# Patient Record
Sex: Male | Born: 1960 | Race: Black or African American | Hispanic: No | Smoking: Current every day smoker
Health system: Southern US, Community
[De-identification: ages and names within clinical notes are randomized; demographics above are authoritative.]

## PROBLEM LIST (undated history)

## (undated) DIAGNOSIS — I10 Essential (primary) hypertension: Secondary | ICD-10-CM

## (undated) DIAGNOSIS — I739 Peripheral vascular disease, unspecified: Secondary | ICD-10-CM

## (undated) DIAGNOSIS — E119 Type 2 diabetes mellitus without complications: Secondary | ICD-10-CM

## (undated) DIAGNOSIS — E1169 Type 2 diabetes mellitus with other specified complication: Secondary | ICD-10-CM

## (undated) HISTORY — DX: Peripheral vascular disease, unspecified: I73.9

## (undated) HISTORY — DX: Type 2 diabetes mellitus without complications: E11.9

## (undated) HISTORY — DX: Essential (primary) hypertension: I10

## (undated) HISTORY — DX: Type 2 diabetes mellitus with other specified complication: E11.69

---

## 2005-04-26 ENCOUNTER — Ambulatory Visit: Payer: Self-pay | Admitting: Family Medicine

## 2005-04-27 ENCOUNTER — Ambulatory Visit (HOSPITAL_COMMUNITY): Admission: RE | Admit: 2005-04-27 | Discharge: 2005-04-27 | Payer: Self-pay | Admitting: Physician Assistant

## 2006-09-05 IMAGING — CR DG THORACIC SPINE 2V
3 series · 3 of 3 positions shown · non-contrast
Comparison: none

CLINICAL DATA: Back pain for 8 years, extending into both legs now, becoming worse over the past month. No known injury.
 THORACIC SPINE ? 3 VIEW:

[view not recorded (1 of 3)]
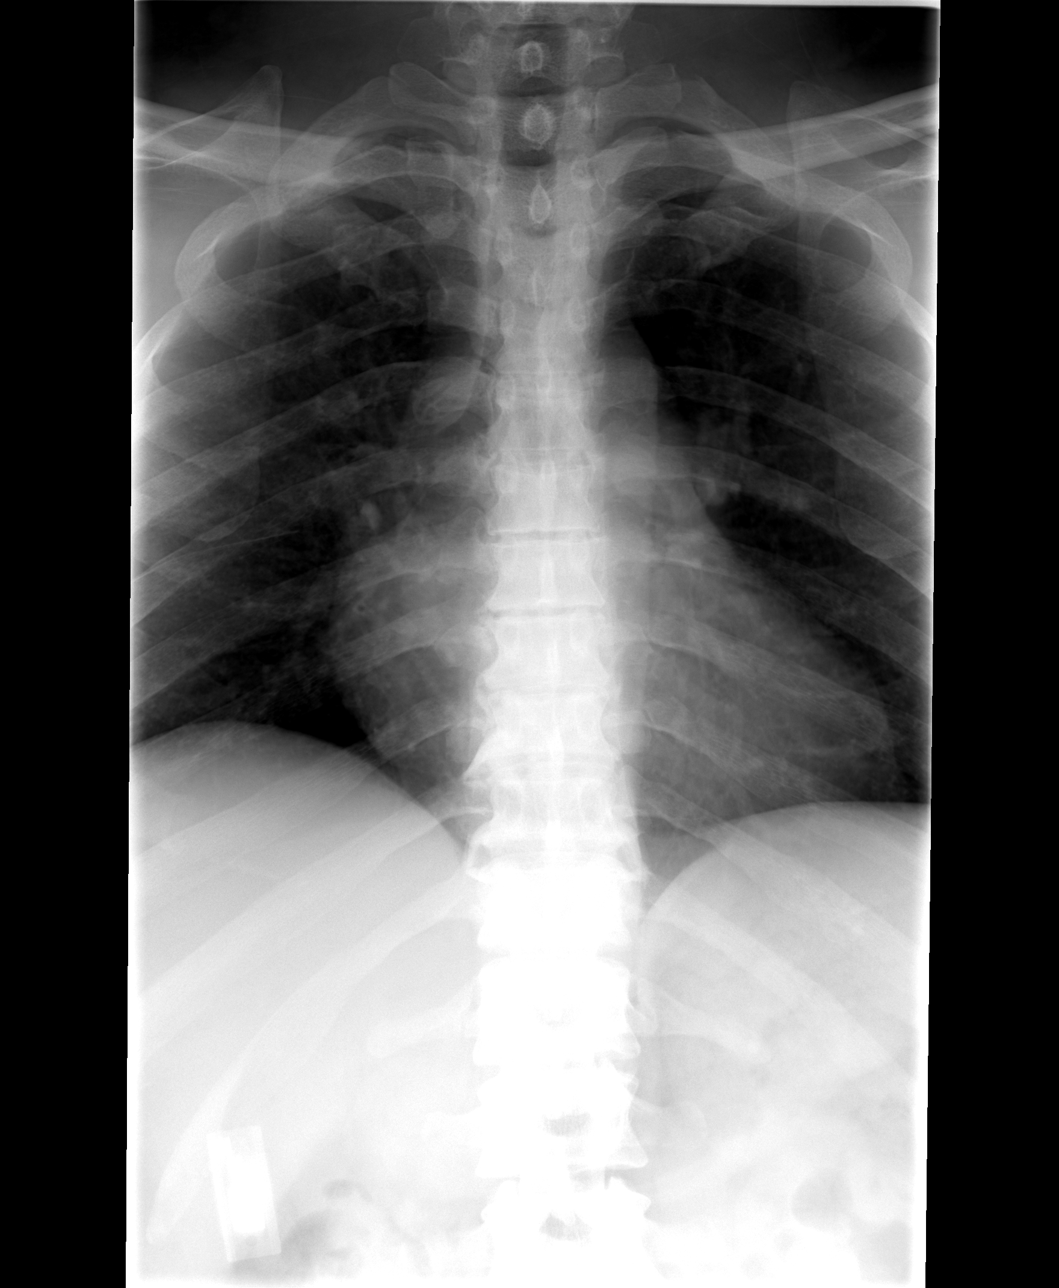

[view not recorded (2 of 3)]
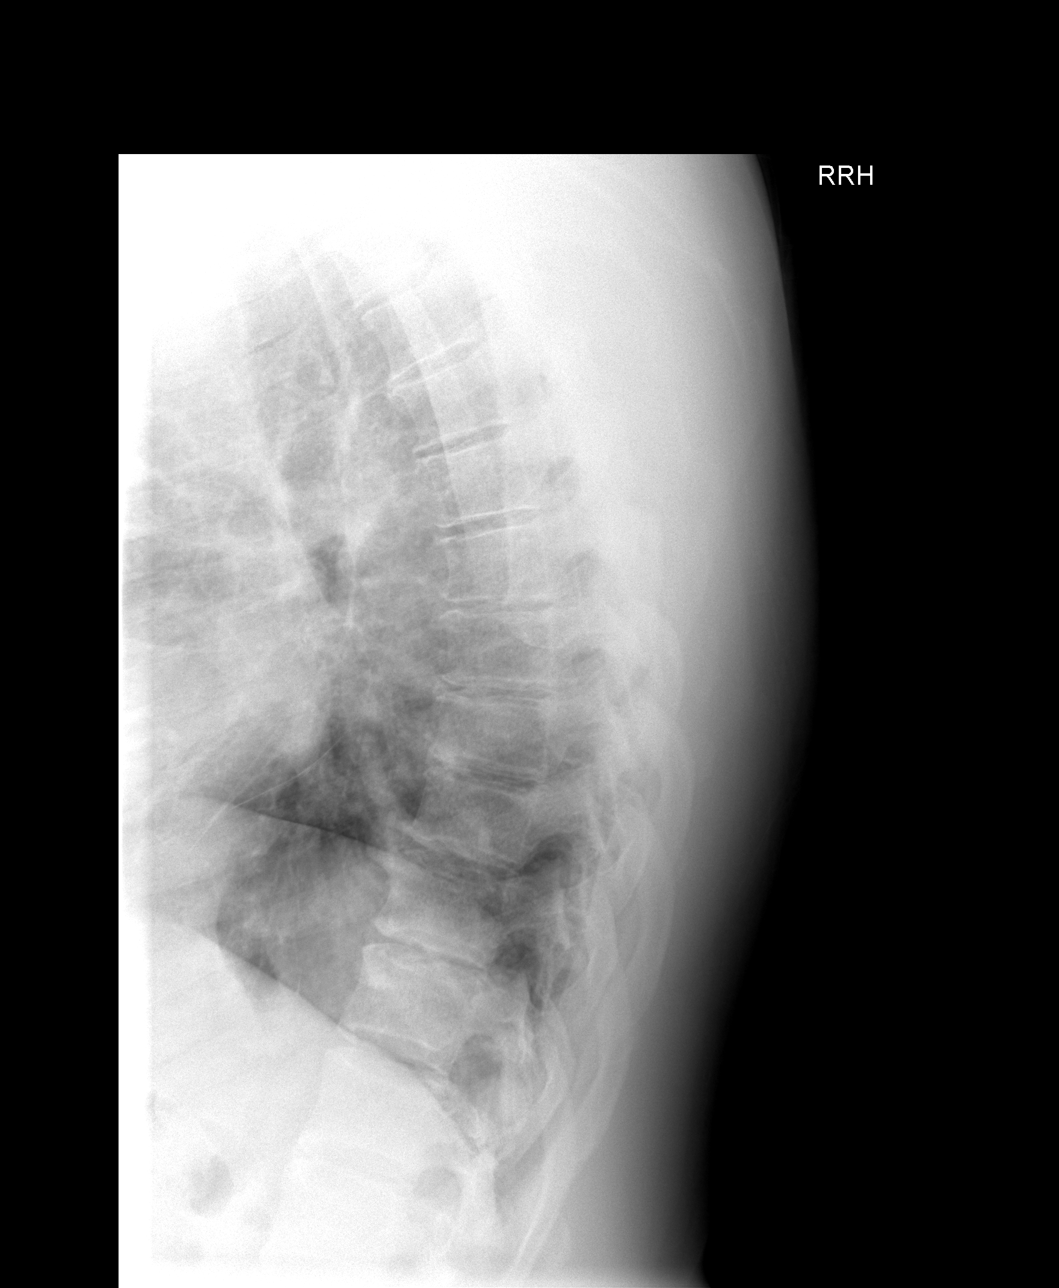

[view not recorded (3 of 3)]
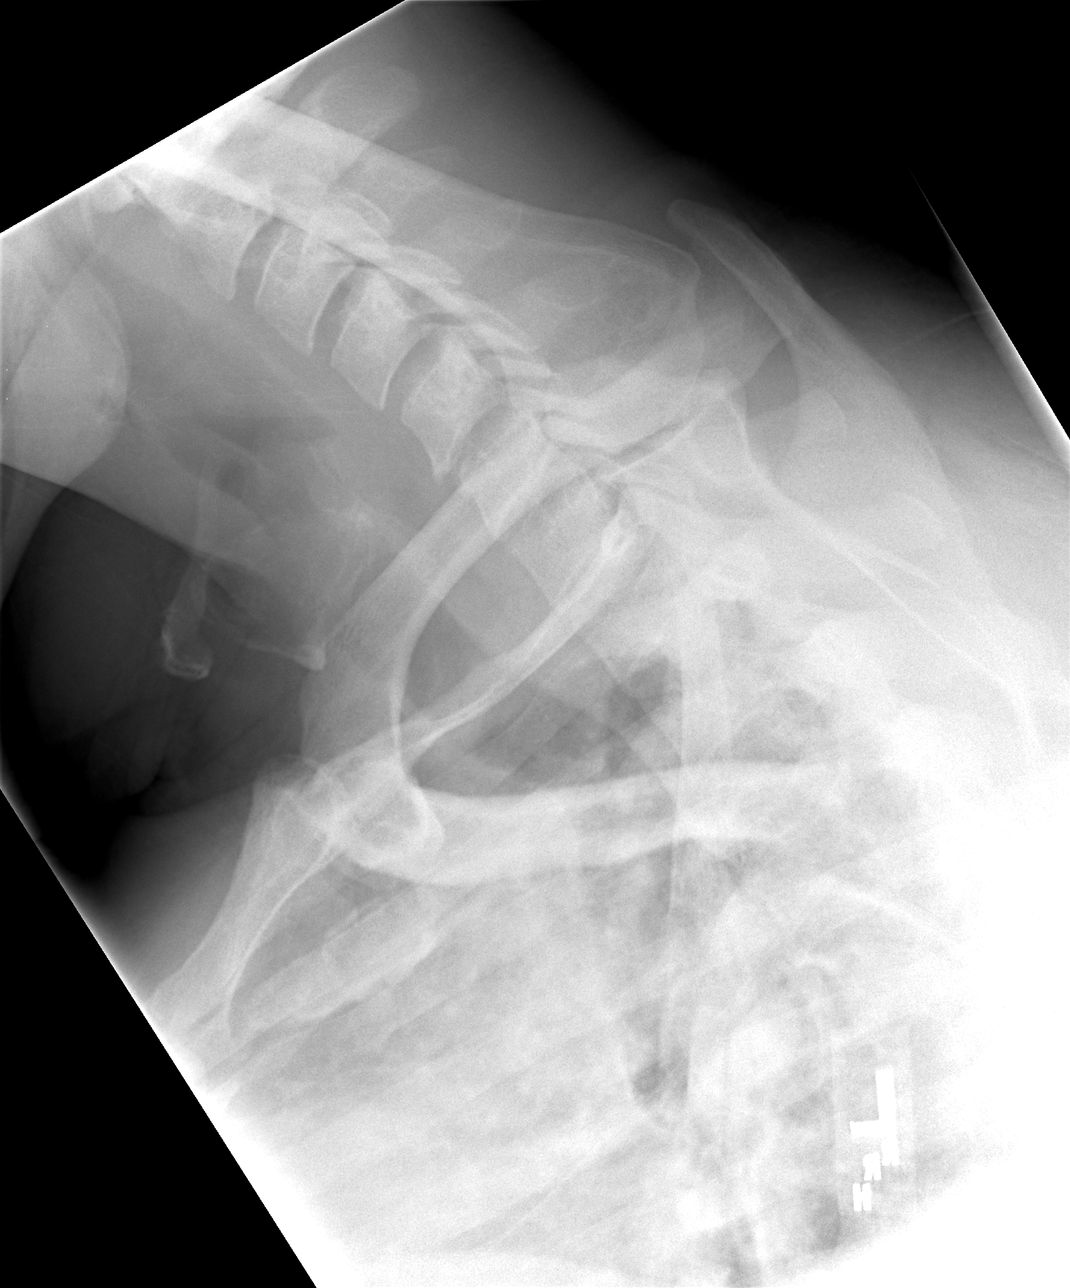

[3 of 3 positions shown; findings below may reference images not displayed]

FINDINGS: Three views of the thoracic spine are made without previous films for comparison and show anterior degenerative hypertrophic spurs associated with the lower thoracic spine, most prominent at the T9-10 and T10-11 levels.  There is no evidence of fracture or metastatic disease.  There is a mild dorsal kyphosis.  Posterior elements and soft tissues appear to be intact.
IMPRESSION: Degenerative hypertrophic spurs lower thoracic spine as described above.  No fracture or metastatic disease is seen.
 LUMBOSACRAL SPINE ? 5 VIEW:
FINDINGS: AP lateral and both oblique views of the lumbosacral spine show small anterior degenerative hypertrophic spurs at the L3-4, L4-5 and L5-S1 levels.  The intervertebral disk spaces and the posterior elements appear to be well maintained. There is no significant congenital abnormality.  The sacroiliac and hip joints appear normal as far as can be seen.
IMPRESSION: Small anterior degenerative hypertrophic spurs mid and lower lumbar spine.  No acute changes are seen.

## 2013-06-18 ENCOUNTER — Ambulatory Visit: Payer: Self-pay | Admitting: Family Medicine

## 2022-08-23 ENCOUNTER — Ambulatory Visit: Payer: Self-pay

## 2022-08-23 ENCOUNTER — Encounter: Payer: Self-pay | Admitting: Podiatry

## 2022-08-23 DIAGNOSIS — M778 Other enthesopathies, not elsewhere classified: Secondary | ICD-10-CM

## 2022-08-28 NOTE — Progress Notes (Signed)
This encounter was created in error - please disregard.

## 2022-08-30 ENCOUNTER — Ambulatory Visit (INDEPENDENT_AMBULATORY_CARE_PROVIDER_SITE_OTHER): Payer: Medicare PPO | Admitting: Podiatry

## 2022-08-30 ENCOUNTER — Encounter: Payer: Self-pay | Admitting: Podiatry

## 2022-08-30 VITALS — BP 178/87 | HR 115 | Temp 99.7°F

## 2022-08-30 DIAGNOSIS — I96 Gangrene, not elsewhere classified: Secondary | ICD-10-CM | POA: Diagnosis not present

## 2022-08-30 DIAGNOSIS — M2041 Other hammer toe(s) (acquired), right foot: Secondary | ICD-10-CM

## 2022-08-30 DIAGNOSIS — I739 Peripheral vascular disease, unspecified: Secondary | ICD-10-CM

## 2022-08-30 MED ORDER — OXYCODONE-ACETAMINOPHEN 5-325 MG PO TABS: 1.0000 | ORAL_TABLET | ORAL | 0 refills | Status: DC | PRN

## 2022-08-30 MED ORDER — DOXYCYCLINE HYCLATE 100 MG PO TABS: 100.0000 mg | ORAL_TABLET | Freq: Two times a day (BID) | ORAL | 0 refills | Status: DC

## 2022-08-30 NOTE — Progress Notes (Signed)
Subjective:  Patient ID: Evan Nelson, male    DOB: 1961-07-25,  MRN: [id number]  Chief Complaint  Patient presents with   Toe Pain    5th toe left - bumped toe 3 months ago, had a small abrasion and them it turned dark, started to swell, worsened and darkened over the last 3 weeks, very sore, throbbing   New Patient (Initial Visit)    62 y.o. male presents with the above complaint.  Patient presents with left fifth digit gangrene.  Patient states that it has progressive gotten worse after he stubbed his toe.  He has not seen anyone as prior to seeing me.  It is dry and stable.  He has not been taking any antibiotics.  He went to get it evaluated.  He does not have any vascular history.   Review of Systems: Negative except as noted in the HPI. Denies N/V/F/Ch.  No past medical history on file. No current facility-administered medications for this visit. No current outpatient medications on file.  Facility-Administered Medications Ordered in Other Visits:    [id number] sodium chloride 0.9 % bolus 1,000 mL, 1,000 mL, Intravenous, Once, Stopped at 09/08/22 1439 **AND** 0.9 %  sodium chloride infusion, , Intravenous, Continuous, Donne Hazel, MD, Last Rate: 75 mL/hr at 09/11/22 0435, New Bag at 09/11/22 0435   0.9 %  sodium chloride infusion, 250 mL, Intravenous, PRN, Broadus John, MD   acetaminophen (TYLENOL) tablet 650 mg, 650 mg, Oral, Q4H PRN, Broadus John, MD   aspirin EC tablet 81 mg, 81 mg, Oral, Daily, Broadus John, MD, 81 mg at 09/11/22 0818   atorvastatin (LIPITOR) tablet 40 mg, 40 mg, Oral, QHS, Broadus John, MD, 40 mg at 09/10/22 1733   cefTRIAXone (ROCEPHIN) 2 g in sodium chloride 0.9 % 100 mL IVPB, 2 g, Intravenous, Q24H, Tat, David, MD, Last Rate: 200 mL/hr at 09/11/22 0826, 2 g at 09/11/22 0826   [START ON 09/12/2022] Chlorhexidine Gluconate Cloth 2 % PADS 6 each, 6 each, Topical, Q0600, Donne Hazel, MD   clopidogrel (PLAVIX) tablet 75 mg, 75 mg,  Oral, Q breakfast, Broadus John, MD, 75 mg at 09/11/22 0818   enoxaparin (LOVENOX) injection 40 mg, 40 mg, Subcutaneous, Q24H, Tat, David, MD, 40 mg at 09/10/22 2138   hydrALAZINE (APRESOLINE) injection 5 mg, 5 mg, Intravenous, Q20 Min PRN, Broadus John, MD   HYDROcodone-acetaminophen (NORCO/VICODIN) 5-325 MG per tablet 1 tablet, 1 tablet, Oral, Q4H PRN, Donne Hazel, MD, 1 tablet at 09/11/22 0134   HYDROmorphone (DILAUDID) injection 1 mg, 1 mg, Intravenous, Q4H PRN, Donne Hazel, MD, 1 mg at 09/11/22 0818   insulin aspart (novoLOG) injection 0-15 Units, 0-15 Units, Subcutaneous, TID WC, Donne Hazel, MD, 2 Units at 09/10/22 1734   insulin aspart (novoLOG) injection 0-5 Units, 0-5 Units, Subcutaneous, QHS, Donne Hazel, MD   labetalol (NORMODYNE) injection 10 mg, 10 mg, Intravenous, Q10 min PRN, Broadus John, MD   mupirocin ointment (BACTROBAN) 2 % 1 Application, 1 Application, Nasal, BID, Donne Hazel, MD   ondansetron [facility]) injection 4 mg, 4 mg, Intravenous, Q6H PRN, Broadus John, MD   sodium chloride flush (NS) 0.9 % injection 3 mL, 3 mL, Intravenous, Q12H, Broadus John, MD, 3 mL at 09/11/22 0252   sodium chloride flush (NS) 0.9 % injection 3 mL, 3 mL, Intravenous, PRN, Broadus John, MD   vancomycin (VANCOREADY) IVPB 1500 mg/300 mL, 1,500 mg, Intravenous, Q24H, Chiu,  Orpah Melter, MD, Last Rate: 150 mL/hr at 09/10/22 1840, 1,500 mg at 09/10/22 1840  Social History   Tobacco Use  Smoking Status Every Day   Types: Cigarettes  Smokeless Tobacco Never    Allergies  Allergen Reactions   Amoxicillin    Penicillins    Objective:   Vitals:   08/30/22 1511  BP: (!) 178/87  Pulse: (!) 115  Temp: 99.7 F (37.6 C)   There is no height or weight on file to calculate BMI. Constitutional Well developed. Well nourished.  Vascular Dorsalis pedis pulses nonpalpable bilaterally. Posterior tibial pulses nonpalpable bilaterally. Capillary refill normal to  all digits.  No cyanosis or clubbing noted. Pedal hair growth normal.  Neurologic Normal speech. Oriented to person, place, and time. Epicritic sensation to light touch grossly present bilaterally.  Dermatologic Left fifth digit gangrene without any signs of infection.  No purulent drainage noted no erythema noted.  No malodor present.  Appears to be dry and stable fully demarcated  Orthopedic: Normal joint ROM without pain or crepitus bilaterally. No visible deformities. No bony tenderness.   Radiographs: None Assessment:   1. Gangrene of toe ([place])   2. Peripheral vascular disease ([place])    Plan:  Patient was evaluated and treated and all questions answered.  Left fifth digit gangrene fully demarcated -All questions and concerns were discussed with the patient in extensive detail -I discussed with the patient that he is a high risk of losing that digit versus part of the foot.  I discussed with the patient in extensive detail he states understanding.  Clinically at this time there is no signs of infection I believe patient will benefit from ABIs PVRs and vascular consult. -If the digit itself regresses advised to go to the emergency room right away he states understanding. -Stat ABIs PVRs were ordered and vascular workup in place.  No follow-ups on file.

## 2022-08-31 ENCOUNTER — Telehealth (HOSPITAL_COMMUNITY): Payer: Self-pay

## 2022-08-31 NOTE — Telephone Encounter (Signed)
Number on file: The number you have dialed is no longer in service Work number on file: [employer], unable to confirm via information on file that the patient works here Emergency contact: Same as number on file

## 2022-09-03 ENCOUNTER — Telehealth: Payer: Self-pay | Admitting: Podiatry

## 2022-09-03 NOTE — Telephone Encounter (Signed)
Pt calling back to check status of medication request. He states his pharmacy closes early and wants to be sure he would be able to pick up medication today if possible.  Please advise

## 2022-09-03 NOTE — Telephone Encounter (Signed)
Pt is requesting a stronger pain medication. He states the oxycodone is not working.  [facility] - [place], [place]   Please advise

## 2022-09-04 MED ORDER — HYDROCODONE-ACETAMINOPHEN 5-325 MG PO TABS: 1.0000 | ORAL_TABLET | Freq: Four times a day (QID) | ORAL | 0 refills | Status: DC | PRN

## 2022-09-04 NOTE — Addendum Note (Signed)
Addended by: Boneta Lucks on: 09/04/2022 08:21 AM   Modules accepted: Orders

## 2022-09-08 ENCOUNTER — Encounter (HOSPITAL_COMMUNITY): Payer: Self-pay | Admitting: Internal Medicine

## 2022-09-08 ENCOUNTER — Inpatient Hospital Stay (HOSPITAL_COMMUNITY)
Admission: EM | Admit: 2022-09-08 | Discharge: 2022-09-14 | DRG: 240 | Disposition: A | Discharge status: Home Health | Payer: Medicare PPO | Attending: Internal Medicine | Admitting: Internal Medicine

## 2022-09-08 ENCOUNTER — Emergency Department (HOSPITAL_COMMUNITY): Payer: Medicare PPO

## 2022-09-08 ENCOUNTER — Other Ambulatory Visit: Payer: Self-pay

## 2022-09-08 DIAGNOSIS — E1152 Type 2 diabetes mellitus with diabetic peripheral angiopathy with gangrene: Principal | ICD-10-CM | POA: Diagnosis present

## 2022-09-08 DIAGNOSIS — L97526 Non-pressure chronic ulcer of other part of left foot with bone involvement without evidence of necrosis: Secondary | ICD-10-CM | POA: Diagnosis present

## 2022-09-08 DIAGNOSIS — I70262 Atherosclerosis of native arteries of extremities with gangrene, left leg: Secondary | ICD-10-CM | POA: Diagnosis present

## 2022-09-08 DIAGNOSIS — M869 Osteomyelitis, unspecified: Secondary | ICD-10-CM

## 2022-09-08 DIAGNOSIS — Z880 Allergy status to penicillin: Secondary | ICD-10-CM | POA: Diagnosis not present

## 2022-09-08 DIAGNOSIS — M86172 Other acute osteomyelitis, left ankle and foot: Secondary | ICD-10-CM | POA: Diagnosis not present

## 2022-09-08 DIAGNOSIS — F17210 Nicotine dependence, cigarettes, uncomplicated: Secondary | ICD-10-CM | POA: Diagnosis present

## 2022-09-08 DIAGNOSIS — I70245 Atherosclerosis of native arteries of left leg with ulceration of other part of foot: Secondary | ICD-10-CM | POA: Diagnosis not present

## 2022-09-08 DIAGNOSIS — R000 Tachycardia, unspecified: Secondary | ICD-10-CM | POA: Diagnosis not present

## 2022-09-08 DIAGNOSIS — I708 Atherosclerosis of other arteries: Secondary | ICD-10-CM | POA: Diagnosis present

## 2022-09-08 DIAGNOSIS — Z7984 Long term (current) use of oral hypoglycemic drugs: Secondary | ICD-10-CM

## 2022-09-08 DIAGNOSIS — Z7982 Long term (current) use of aspirin: Secondary | ICD-10-CM | POA: Diagnosis not present

## 2022-09-08 DIAGNOSIS — I96 Gangrene, not elsewhere classified: Secondary | ICD-10-CM

## 2022-09-08 DIAGNOSIS — E1165 Type 2 diabetes mellitus with hyperglycemia: Secondary | ICD-10-CM | POA: Diagnosis present

## 2022-09-08 DIAGNOSIS — M86672 Other chronic osteomyelitis, left ankle and foot: Secondary | ICD-10-CM | POA: Diagnosis not present

## 2022-09-08 DIAGNOSIS — M7989 Other specified soft tissue disorders: Secondary | ICD-10-CM | POA: Diagnosis present

## 2022-09-08 DIAGNOSIS — E1169 Type 2 diabetes mellitus with other specified complication: Secondary | ICD-10-CM | POA: Diagnosis present

## 2022-09-08 DIAGNOSIS — Z79899 Other long term (current) drug therapy: Secondary | ICD-10-CM | POA: Diagnosis not present

## 2022-09-08 DIAGNOSIS — E11621 Type 2 diabetes mellitus with foot ulcer: Secondary | ICD-10-CM | POA: Diagnosis present

## 2022-09-08 DIAGNOSIS — I70229 Atherosclerosis of native arteries of extremities with rest pain, unspecified extremity: Secondary | ICD-10-CM | POA: Diagnosis not present

## 2022-09-08 HISTORY — DX: Gangrene, not elsewhere classified: I96

## 2022-09-08 HISTORY — DX: Osteomyelitis, unspecified: M86.9

## 2022-09-08 HISTORY — DX: Other acute osteomyelitis, left ankle and foot: M86.172

## 2022-09-08 LAB — CULTURE, BLOOD (ROUTINE X 2)

## 2022-09-08 LAB — CBC WITH DIFFERENTIAL/PLATELET
Abs Immature Granulocytes: 0.1 10*3/uL — ABNORMAL HIGH (ref 0.00–0.07)
Basophils Absolute: 0.1 10*3/uL (ref 0.0–0.1)
Basophils Relative: 1 %
Eosinophils Absolute: 0.2 10*3/uL (ref 0.0–0.5)
Eosinophils Relative: 2 %
HCT: 47 % (ref 39.0–52.0)
Hemoglobin: 15.3 g/dL (ref 13.0–17.0)
Immature Granulocytes: 1 %
Lymphocytes Relative: 29 %
Lymphs Abs: 3 10*3/uL (ref 0.7–4.0)
MCH: 29.3 pg (ref 26.0–34.0)
MCHC: 32.6 g/dL (ref 30.0–36.0)
MCV: 89.9 fL (ref 80.0–100.0)
Monocytes Absolute: 0.6 10*3/uL (ref 0.1–1.0)
Monocytes Relative: 5 %
Neutro Abs: 6.5 10*3/uL (ref 1.7–7.7)
Neutrophils Relative %: 62 %
Platelets: 566 10*3/uL — ABNORMAL HIGH (ref 150–400)
RBC: 5.23 MIL/uL (ref 4.22–5.81)
RDW: 13.6 % (ref 11.5–15.5)
WBC: 10.4 10*3/uL (ref 4.0–10.5)
nRBC: 0 % (ref 0.0–0.2)

## 2022-09-08 LAB — COMPREHENSIVE METABOLIC PANEL
ALT: 30 U/L (ref 0–44)
AST: 22 U/L (ref 15–41)
Albumin: 3.7 g/dL (ref 3.5–5.0)
Alkaline Phosphatase: 87 U/L (ref 38–126)
Anion gap: 13 (ref 5–15)
BUN: 16 mg/dL (ref 8–23)
CO2: 26 mmol/L (ref 22–32)
Calcium: 9.8 mg/dL (ref 8.9–10.3)
Chloride: 96 mmol/L — ABNORMAL LOW (ref 98–111)
Creatinine, Ser: 1.22 mg/dL (ref 0.61–1.24)
GFR, Estimated: >60 mL/min (ref >60)
Glucose, Bld: 184 mg/dL — ABNORMAL HIGH (ref 70–99)
Potassium: 3.9 mmol/L (ref 3.5–5.1)
Sodium: 135 mmol/L (ref 135–145)
Total Bilirubin: 0.5 mg/dL (ref 0.3–1.2)
Total Protein: 8.5 g/dL — ABNORMAL HIGH (ref 6.5–8.1)

## 2022-09-08 LAB — LACTIC ACID, PLASMA: Lactic Acid, Venous: 1.6 mmol/L (ref 0.5–1.9)

## 2022-09-08 MED ORDER — VANCOMYCIN HCL 1250 MG/250ML IV SOLN
1250.0000 mg | INTRAVENOUS | Status: DC
  Filled 2022-09-08: qty 250

## 2022-09-08 MED ORDER — HYDROCODONE-ACETAMINOPHEN 5-325 MG PO TABS
1.0000 | ORAL_TABLET | Freq: Four times a day (QID) | ORAL | Status: DC | PRN
  Administered 2022-09-08 – 2022-09-10 (×5): 1 via ORAL
  Filled 2022-09-08 (×6): qty 1

## 2022-09-08 MED ORDER — ENOXAPARIN SODIUM 40 MG/0.4ML IJ SOSY
40.0000 mg | PREFILLED_SYRINGE | INTRAMUSCULAR | Status: DC
  Administered 2022-09-10 – 2022-09-13 (×4): 40 mg via SUBCUTANEOUS
  Filled 2022-09-08 (×5): qty 0.4

## 2022-09-08 MED ORDER — VANCOMYCIN HCL 2000 MG/400ML IV SOLN
2000.0000 mg | Freq: Once | INTRAVENOUS | Status: AC
  Administered 2022-09-08: 2000 mg via INTRAVENOUS
  Filled 2022-09-08: qty 400

## 2022-09-08 MED ORDER — SODIUM CHLORIDE 0.9 % IV SOLN
2.0000 g | INTRAVENOUS | Status: DC
  Administered 2022-09-08: 2 g via INTRAVENOUS
  Filled 2022-09-08: qty 20

## 2022-09-08 MED ORDER — SODIUM CHLORIDE 0.9 % IV SOLN: INTRAVENOUS | Status: DC

## 2022-09-08 MED ORDER — SODIUM CHLORIDE 0.9 % IV SOLN
2.0000 g | INTRAVENOUS | Status: DC
  Administered 2022-09-09 – 2022-09-13 (×5): 2 g via INTRAVENOUS
  Filled 2022-09-08 (×5): qty 20

## 2022-09-08 MED ORDER — METRONIDAZOLE 500 MG/100ML IV SOLN
500.0000 mg | Freq: Two times a day (BID) | INTRAVENOUS | Status: DC
  Administered 2022-09-08: 500 mg via INTRAVENOUS
  Filled 2022-09-08: qty 100

## 2022-09-08 MED ORDER — SODIUM CHLORIDE 0.9 % IV BOLUS
1000.0000 mL | Freq: Once | INTRAVENOUS | Status: AC
  Administered 2022-09-08: 1000 mL via INTRAVENOUS

## 2022-09-08 NOTE — ED Triage Notes (Signed)
Pt to ED c/o left small toe pain/drainage x 3 months, reports progressively getting worse, reports pending apt for possible amputation.

## 2022-09-08 NOTE — Consult Note (Signed)
   Received a call from Forestine Na, ED.  Worsening infection with osteomyelitis of the foot.  Plans for transfer to [facility] for full workup and likely amputation.  Podiatry will consult on patient once admitted to [facility].   Edrick Kins, DPM [facility]  Dr. Edrick Kins, DPM    [address], [place] [address]                Office [phone]  Fax [phone]

## 2022-09-08 NOTE — ED Provider Notes (Signed)
[facility] Provider Note   CSN: [id number] Arrival date & time: 09/08/22  1202     History  Chief Complaint  Patient presents with   Toe Pain    Left little    Evan Nelson is a 62 y.o. male.   Toe Pain  Patient presents for left foot infection.  Has had chronic issues with his left fifth toe.  Had recent seen podiatry with plan of vascular workup.  Started on antibiotics and pain medicine.  However now more pain and more swelling.  No fevers but increasing redness on the foot.  States has been using peroxide on the toe and now the toe is turned black.  Had seen Dr. Posey Pronto from [facility].    No past medical history on file.  Home Medications Prior to Admission medications   Medication Sig Start Date End Date Taking? Authorizing Provider  doxycycline (VIBRA-TABS) 100 MG tablet Take 1 tablet (100 mg total) by mouth 2 (two) times daily. 08/30/22   Felipa Furnace, DPM  HYDROcodone-acetaminophen (NORCO) 5-325 MG tablet Take 1 tablet by mouth every 6 (six) hours as needed for moderate pain. 09/04/22   Felipa Furnace, DPM  oxyCODONE-acetaminophen (PERCOCET) 5-325 MG tablet Take 1 tablet by mouth every 4 (four) hours as needed for severe pain. 08/30/22   Felipa Furnace, DPM      Allergies    Amoxicillin and Penicillins    Review of Systems   Review of Systems  Physical Exam Updated Vital Signs BP 138/77   Pulse (!) 128   Temp 98.4 F (36.9 C) (Oral)   Resp 20   Ht 5' 7"$  (1.702 m)   Wt 89.4 kg   SpO2 96%   BMI 30.85 kg/m  Physical Exam Vitals and nursing note reviewed.  Cardiovascular:     Rate and Rhythm: Tachycardia present.  Pulmonary:     Breath sounds: No wheezing.  Abdominal:     Tenderness: There is no abdominal tenderness.  Musculoskeletal:     Comments: Erythema with some swelling along dorsum of left foot.  Swelling compared to contralateral side.  Does have dopplerable dorsalis pedis pulse.  Does have  blackening of the fifth toe but is some liquid air proximal to this.  Neurological:     Mental Status: He is alert.     ED Results / Procedures / Treatments   Labs (all labs ordered are listed, but only abnormal results are displayed) Labs Reviewed  COMPREHENSIVE METABOLIC PANEL - Abnormal; Notable for the following components:      Result Value   Chloride 96 (*)    Glucose, Bld 184 (*)    Total Protein 8.5 (*)    All other components within normal limits  CBC WITH DIFFERENTIAL/PLATELET - Abnormal; Notable for the following components:   Platelets 566 (*)    Abs Immature Granulocytes 0.10 (*)    All other components within normal limits  CULTURE, BLOOD (ROUTINE X 2)  CULTURE, BLOOD (ROUTINE X 2)  LACTIC ACID, PLASMA    EKG None  Radiology DG Foot Complete Left  Result Date: 09/08/2022 CLINICAL DATA:  Left small toe infection EXAM: LEFT FOOT - COMPLETE 3+ VIEW COMPARISON:  None Available. FINDINGS: Erosive changes of the distal phalanx of the left fifth toe compatible with osteomyelitis. Soft tissue irregularity with soft tissue air at the fifth toe distally. No additional sites of bone erosion or periosteal elevation. No fracture or  dislocation IMPRESSION: Erosive changes of the distal phalanx of the left fifth toe compatible with osteomyelitis. Soft tissue irregularity with soft tissue air at the fifth toe distally. Electronically Signed   By: Davina Poke D.O.   On: 09/08/2022 14:00    Procedures Procedures    Medications Ordered in ED Medications  sodium chloride 0.9 % bolus 1,000 mL (1,000 mLs Intravenous New Bag/Given 09/08/22 1312)    And  0.9 %  sodium chloride infusion (has no administration in time range)  cefTRIAXone (ROCEPHIN) 2 g in sodium chloride 0.9 % 100 mL IVPB (has no administration in time range)    And  metroNIDAZOLE (FLAGYL) IVPB 500 mg (has no administration in time range)    ED Course/ Medical Decision Making/ A&P                              Medical Decision Making Amount and/or Complexity of Data Reviewed Labs: ordered. Radiology: ordered.  Risk Prescription drug management.   Patient with foot infection.  Tachycardia.  Has been on doxycycline without relief.  Appears chronically necrotic toe but appears to be acute on chronic infection.  Differential diagnoses really determines the amount of infection and the extent of it.  X-ray done and independently interpreted showed likely osteomyelitis.  Is some air proximally.  White count reassuring.  Lactic acid reassuring.  Antibiotics given.  Has penicillin allergy but appears candidate for cephalosporin. Discussed with Dr. Amalia Hailey from podiatry.  Will see patient in consult since he is one of their patients but will need transfer to [facility].  Likely require surgery.  Will discuss with hospitalist for admission.        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

## 2022-09-08 NOTE — Hospital Course (Addendum)
62 year old male with no documented chronic medical history presenting with pain and edema of his left foot.  The patient states that he bumped his little toe about 6 months ago.  He subsequently dropped an object on it a few months later.  He noticed a sore that was not healing.  He subsequently went to see podiatry, Dr. Boneta Lucks, on 08/30/2022.  The patient was given a prescription for norco and doxycycline.  The plan was for the patient to have outpatient vascular studies to be performed.  The patient was also instructed to paint his foot with Betadine.  Unfortunately, the patient pain with his foot particularly with weightbearing.  He stated that it is not particularly any worse, but was concerned that his fifth toe was turning more black.  He continues to complain of swelling and erythema.  He states that they were not any worse than usual.  He denies any fevers, chills, headache, chest pain, shortness breath, cough, hemoptysis, nausea, vomiting, direct abdominal pain.  There is no dysuria, hematuria. In the ED, the patient was afebrile hemodynamically stable with oxygen saturation 96% room air.  WBC 10.4, hemoglobin 15.3, platelets 566,000.  Sodium 135, potassium 3.9, bicarbonate 26, BUN 16, creatinine 1.22.  LFTs were unremarkable.  X-ray of the left foot shows erosive changes of the distal phalanx of the left fifth toe.  There was a small amount of soft tissue air in the left fifth toe distally.  Podiatry, Dr. Daylene Katayama was consulted and recommend transfer to [facility] for evaluation and possible operative intervention.  I also contacted [facility], Dr. Trula Slade who is agreeable for consult.

## 2022-09-08 NOTE — Plan of Care (Signed)

## 2022-09-08 NOTE — H&P (Signed)
History and Physical    Patient: Evan Nelson [id number] DOB: 02/09/1961 DOA: 09/08/2022 DOS: the patient was seen and examined on 09/08/2022 PCP: Pcp, No  Patient coming from: Home  Chief Complaint:  Chief Complaint  Patient presents with   Toe Pain    Left little   HPI: Evan Nelson is a 62 year old male with no documented chronic medical history presenting with pain and edema of his left foot.  The patient states that he bumped his little toe about 6 months ago.  He subsequently dropped an object on it a few months later.  He noticed a sore that was not healing.  He subsequently went to see podiatry, Dr. Boneta Lucks, on 08/30/2022.  The patient was given a prescription for norco and doxycycline.  The plan was for the patient to have outpatient vascular studies to be performed.  The patient was also instructed to paint his foot with Betadine.  Unfortunately, the patient pain with his foot particularly with weightbearing.  He stated that it is not particularly any worse, but was concerned that his fifth toe was turning more black.  He continues to complain of swelling and erythema.  He states that they were not any worse than usual.  He denies any fevers, chills, headache, chest pain, shortness breath, cough, hemoptysis, nausea, vomiting, direct abdominal pain.  There is no dysuria, hematuria. In the ED, the patient was afebrile hemodynamically stable with oxygen saturation 96% room air.  WBC 10.4, hemoglobin 15.3, platelets 566,000.  Sodium 135, potassium 3.9, bicarbonate 26, BUN 16, creatinine 1.22.  LFTs were unremarkable.  X-ray of the left foot shows erosive changes of the distal phalanx of the left fifth toe.  There was a small amount of soft tissue air in the left fifth toe distally.  Podiatry, Dr. Daylene Katayama was consulted and recommend transfer to [facility] for evaluation and possible operative intervention.  I also contacted [facility], Dr. Trula Slade who is agreeable for consult.  Review of  Systems: As mentioned in the history of present illness. All other systems reviewed and are negative. No past medical history on file. Surg Hx:  denies prior surgery Social History:  reports that he has been smoking cigarettes. He has never used smokeless tobacco. He reports that he does not currently use alcohol. No history on file for drug use.  25 pack years  Allergies  Allergen Reactions   Amoxicillin    Penicillins     Family hx--no pertinent family hx  Prior to Admission medications   Medication Sig Start Date End Date Taking? Authorizing Provider  acetaminophen (TYLENOL) 500 MG tablet Take 1,000 mg by mouth every 6 (six) hours as needed for moderate pain.   Yes [provider]  doxycycline (VIBRA-TABS) 100 MG tablet Take 1 tablet (100 mg total) by mouth 2 (two) times daily. 08/30/22  Yes Felipa Furnace, DPM  HYDROcodone-acetaminophen (NORCO) 5-325 MG tablet Take 1 tablet by mouth every 6 (six) hours as needed for moderate pain. 09/04/22  Yes Felipa Furnace, DPM  oxyCODONE-acetaminophen (PERCOCET) 5-325 MG tablet Take 1 tablet by mouth every 4 (four) hours as needed for severe pain. Patient not taking: Reported on 09/08/2022 08/30/22   Felipa Furnace, DPM    Physical Exam: Vitals:   09/08/22 1231 09/08/22 1233 09/08/22 1439  BP: 138/77  (!) 159/99  Pulse: (!) 128  96  Resp: 20  (!) 22  Temp: 98.4 F (36.9 C)    TempSrc: Oral  SpO2: 96%  100%  Weight:  89.4 kg   Height:  5' 7"$  (1.702 m)    GENERAL:  A&O x 3, NAD, well developed, cooperative, follows commands HEENT: [place]/AT, No thrush, No icterus, No oral ulcers Neck:  No neck mass, No meningismus, soft, supple CV: RRR, no S3, no S4, no rub, no JVD Lungs:  CTA, no wheeze, no rhonchi, good air movement Abd: soft/NT +BS, nondistended Ext: see pics of left foot below Neuro:  CN II-XII intact, strength 4/5 in RUE, RLE, strength 4/5 LUE, LLE; sensation intact bilateral; no dysmetria; babinski equivocal        Data  Reviewed: {Data reviewed above in history  Assessment and Plan: Acute osteomyelitis left fifth toe/dry gangrene left foot -The patient likely has underlying peripheral vascular disease -His foot is cool to the touch without palpable pulse, but he has sensory and motor function -Start vancomycin and ceftriaxone -ESR -CRP -ABIs -Discussed with Dr. Crisoforo Oxford to consult>>please contact [facility] upon arrival -Podiatry, Dr. Daylene Katayama also aware of consult  Hyperglycemia -Nonfasting serum glucose 184 upon arrival -Check hemoglobin A1c  Personal History of PCN allergy -occurred 30 yr ago>>hives   Advance Care Planning:FULL  Consults: [facility], podiatry  Family Communication: none  Severity of Illness: The appropriate patient status for this patient is INPATIENT. Inpatient status is judged to be reasonable and necessary in order to provide the required intensity of service to ensure the patient's safety. The patient's presenting symptoms, physical exam findings, and initial radiographic and laboratory data in the context of their chronic comorbidities is felt to place them at high risk for further clinical deterioration. Furthermore, it is not anticipated that the patient will be medically stable for discharge from the hospital within 2 midnights of admission.   * I certify that at the point of admission it is my clinical judgment that the patient will require inpatient hospital care spanning beyond 2 midnights from the point of admission due to high intensity of service, high risk for further deterioration and high frequency of surveillance required.*  Author: Orson Eva, MD 09/08/2022 3:45 PM  For on call review [email or web address].

## 2022-09-08 NOTE — Progress Notes (Signed)
Pharmacy Antibiotic Note  Evan Nelson is a 62 y.o. male admitted on 09/08/2022 with  osteomyelitis .  Pharmacy has been consulted for vancomycin dosing.  Plan: Vancomycin 2000 mg IV x 1 dose. Vancomycin 1250 mg IV every 24 hours. Ceftriaxone 2000 mg IV every 24 hours. Monitor labs, c/s, and vanco level as indicated.  Height: 5' 7"$  (170.2 cm) Weight: 89.4 kg (197 lb) IBW/kg (Calculated) : 66.1  Temp (24hrs), Avg:98.4 F (36.9 C), Min:98.4 F (36.9 C), Max:98.4 F (36.9 C)  Recent Labs  Lab 09/08/22 1245  WBC 10.4  CREATININE 1.22  LATICACIDVEN 1.6    Estimated Creatinine Clearance: 66.1 mL/min (by C-G formula based on SCr of 1.22 mg/dL).    Allergies  Allergen Reactions   Amoxicillin    Penicillins     Antimicrobials this admission: Vanco 2/10 >>  CTX 2/10>>  Microbiology results: 2/10 BCx: pending   Thank you for allowing pharmacy to be a part of this patient's care.  Margot Ables, PharmD Clinical Pharmacist 09/08/2022 3:57 PM

## 2022-09-09 ENCOUNTER — Encounter (HOSPITAL_COMMUNITY): Payer: Medicare PPO

## 2022-09-09 DIAGNOSIS — I70262 Atherosclerosis of native arteries of extremities with gangrene, left leg: Secondary | ICD-10-CM

## 2022-09-09 DIAGNOSIS — M86172 Other acute osteomyelitis, left ankle and foot: Secondary | ICD-10-CM | POA: Diagnosis not present

## 2022-09-09 DIAGNOSIS — M869 Osteomyelitis, unspecified: Secondary | ICD-10-CM | POA: Diagnosis not present

## 2022-09-09 DIAGNOSIS — M86672 Other chronic osteomyelitis, left ankle and foot: Secondary | ICD-10-CM

## 2022-09-09 LAB — CBC
HCT: 41.2 % (ref 39.0–52.0)
Hemoglobin: 13.8 g/dL (ref 13.0–17.0)
MCH: 29.8 pg (ref 26.0–34.0)
MCHC: 33.5 g/dL (ref 30.0–36.0)
MCV: 89 fL (ref 80.0–100.0)
Platelets: 488 10*3/uL — ABNORMAL HIGH (ref 150–400)
RBC: 4.63 MIL/uL (ref 4.22–5.81)
RDW: 13.6 % (ref 11.5–15.5)
WBC: 9.9 10*3/uL (ref 4.0–10.5)
nRBC: 0 % (ref 0.0–0.2)

## 2022-09-09 LAB — GLUCOSE, CAPILLARY
Glucose-Capillary: 180 mg/dL — ABNORMAL HIGH (ref 70–99)
Glucose-Capillary: 175 mg/dL — ABNORMAL HIGH (ref 70–99)

## 2022-09-09 LAB — BASIC METABOLIC PANEL WITH GFR
Anion gap: 11 (ref 5–15)
BUN: 14 mg/dL (ref 8–23)
CO2: 22 mmol/L (ref 22–32)
Calcium: 9 mg/dL (ref 8.9–10.3)
Chloride: 102 mmol/L (ref 98–111)
Creatinine, Ser: 0.86 mg/dL (ref 0.61–1.24)
GFR, Estimated: >60 mL/min
Glucose, Bld: 146 mg/dL — ABNORMAL HIGH (ref 70–99)
Potassium: 4.1 mmol/L (ref 3.5–5.1)
Sodium: 135 mmol/L (ref 135–145)

## 2022-09-09 LAB — HEMOGLOBIN A1C
Hgb A1c MFr Bld: 8.6 % — ABNORMAL HIGH (ref 4.8–5.6)
Mean Plasma Glucose: 200.12 mg/dL

## 2022-09-09 LAB — HIV ANTIBODY (ROUTINE TESTING W REFLEX): HIV Screen 4th Generation wRfx: NONREACTIVE

## 2022-09-09 LAB — C-REACTIVE PROTEIN: CRP: 7.2 mg/dL — ABNORMAL HIGH (ref <1.0)

## 2022-09-09 LAB — CULTURE, BLOOD (ROUTINE X 2)

## 2022-09-09 LAB — SEDIMENTATION RATE: Sed Rate: 61 mm/h — ABNORMAL HIGH (ref 0–16)

## 2022-09-09 MED ORDER — HYDROMORPHONE HCL 1 MG/ML IJ SOLN
0.5000 mg | INTRAMUSCULAR | Status: DC | PRN
  Administered 2022-09-09 – 2022-09-10 (×7): 0.5 mg via INTRAVENOUS
  Filled 2022-09-09 (×7): qty 0.5

## 2022-09-09 MED ORDER — INSULIN ASPART 100 UNIT/ML IJ SOLN
0.0000 [IU] | Freq: Every day | INTRAMUSCULAR | Status: DC
  Administered 2022-09-11: 2 [IU] via SUBCUTANEOUS

## 2022-09-09 MED ORDER — VANCOMYCIN HCL 1500 MG/300ML IV SOLN
1500.0000 mg | INTRAVENOUS | Status: DC
  Administered 2022-09-09 – 2022-09-12 (×4): 1500 mg via INTRAVENOUS
  Filled 2022-09-09 (×6): qty 300

## 2022-09-09 MED ORDER — INSULIN ASPART 100 UNIT/ML IJ SOLN
0.0000 [IU] | Freq: Three times a day (TID) | INTRAMUSCULAR | Status: DC
  Administered 2022-09-10: 2 [IU] via SUBCUTANEOUS
  Administered 2022-09-12: 3 [IU] via SUBCUTANEOUS
  Administered 2022-09-12 – 2022-09-13 (×3): 2 [IU] via SUBCUTANEOUS

## 2022-09-09 NOTE — Progress Notes (Signed)
Progress Note   Patient: Evan Nelson DOB: 15-Jul-1961 DOA: 09/08/2022     1 DOS: the patient was seen and examined on 09/09/2022   Brief hospital course: 62 year old male with no documented chronic medical history presenting with pain and edema of his left foot.  The patient states that he bumped his little toe about 6 months ago.  He subsequently dropped an object on it a few months later.  He noticed a sore that was not healing.  He subsequently went to see podiatry, Dr. Boneta Lucks, on 08/30/2022.  The patient was given a prescription for norco and doxycycline.  The plan was for the patient to have outpatient vascular studies to be performed.  The patient was also instructed to paint his foot with Betadine.  Unfortunately, the patient pain with his foot particularly with weightbearing.  He stated that it is not particularly any worse, but was concerned that his fifth toe was turning more black.  He continues to complain of swelling and erythema.  He states that they were not any worse than usual.  He denies any fevers, chills, headache, chest pain, shortness breath, cough, hemoptysis, nausea, vomiting, direct abdominal pain.  There is no dysuria, hematuria. In the ED, the patient was afebrile hemodynamically stable with oxygen saturation 96% room air.  WBC 10.4, hemoglobin 15.3, platelets 566,000.  Sodium 135, potassium 3.9, bicarbonate 26, BUN 16, creatinine 1.22.  LFTs were unremarkable.  X-ray of the left foot shows erosive changes of the distal phalanx of the left fifth toe.  There was a small amount of soft tissue air in the left fifth toe distally.  Podiatry, Dr. Daylene Katayama was consulted and recommend transfer to [facility] for evaluation and possible operative intervention.  I also contacted [facility], Dr. Trula Slade who is agreeable for consult.  Assessment and Plan: Acute osteomyelitis left fifth toe/dry gangrene left foot -The patient likely has underlying peripheral vascular  disease -vascular Surgery and Podiatry consulted -on empiric vancomycin and ceftriaxone -f/u on cmp and cbc   Hyperglycemia secondary to new diagnosis of DM2 -Nonfasting serum glucose 184 upon arrival -A1c is elevated at 8.6 -Will start SSI coverage while in hospital   Personal History of PCN allergy -occurred 30 yr ago>>hives         Subjective: Without complaints. Reports feeling hungry, wanting to eat  Physical Exam: Vitals:   09/08/22 2004 09/08/22 2143 09/09/22 0534 09/09/22 0800  BP: (!) 141/69 135/72 (!) 146/74 109/66  Pulse: 100 98 93 92  Resp: 16 18 18 16   Temp: 98 F (36.7 C) 98.2 F (36.8 C) 98.4 F (36.9 C) 98.3 F (36.8 C)  TempSrc: Oral Oral Oral Oral  SpO2: 100% 98% 98% 100%  Weight:      Height:       General exam: Awake, laying in bed, in nad Respiratory system: Normal respiratory effort, no wheezing Cardiovascular system: regular rate, s1, s2 Gastrointestinal system: Soft, nondistended, positive BS Central nervous system: CN2-12 grossly intact, strength intact Extremities: L small toe necrotic, no clubbing elsewhere Skin: Normal skin turgor, no notable skin lesions seen Psychiatry: Mood normal // no visual hallucinations   Data Reviewed:  Labs reviewed: Na 135, K 4.1, Cr 0.86  Family Communication: Pt in room, family not at bedside  Disposition: Status is: Inpatient Remains inpatient appropriate because: Severity of illness  Planned Discharge Destination:  Unclear at this time     Author: Marylu Lund, MD 09/09/2022 2:05 PM  For on call review  [email or web address].  

## 2022-09-09 NOTE — Consult Note (Signed)
   PODIATRY CONSULTATION  NAME VINSON TIETZE MRN [id number] DOB 01-18-1961 DOA 09/08/2022   Reason for consult: Ischemic gangrene left foot Chief Complaint  Patient presents with   Toe Pain    Left little    Consulting physician: Marylu Lund, [facility]  History of present illness: 62 y.o. male transferred from [facility], ED, admitted to [facility] for ischemic changes to the left fifth toe with concerns for possible underlying osteomyelitis.  Originally scheduled for outpatient vascular studies however patient was concerned due to his fifth toe turning black with swelling and erythema.  Patient admitted and podiatry and vascular consulted.  History reviewed. No pertinent past medical history.     Latest Ref Rng & Units 09/09/2022   12:05 AM 09/08/2022   12:45 PM  CBC  WBC 4.0 - 10.5 K/uL 9.9  10.4   Hemoglobin 13.0 - 17.0 g/dL 13.8  15.3   Hematocrit 39.0 - 52.0 % 41.2  47.0   Platelets 150 - 400 K/uL 488  566        Latest Ref Rng & Units 09/09/2022   12:05 AM 09/08/2022   12:45 PM  BMP  Glucose 70 - 99 mg/dL 146  184   BUN 8 - 23 mg/dL 14  16   Creatinine 0.61 - 1.24 mg/dL 0.86  1.22   Sodium 135 - 145 mmol/L 135  135   Potassium 3.5 - 5.1 mmol/L 4.1  3.9   Chloride 98 - 111 mmol/L 102  96   CO2 22 - 32 mmol/L 22  26   Calcium 8.9 - 10.3 mg/dL 9.0  9.8        ASSESSMENT/PLAN OF CARE Critical limb ischemia with gangrenous changes left foot -Patient evaluated.  Noninvasive vascular studies have been ordered and pending.  -Plan for vascular angiography with intervention.  Agree with Dr. Trula Slade, podiatry will wait until the patient has been optimized from a vascular standpoint before proceeding with any type of amputation. -Will continue to follow     Thank you for the consult.  Please contact me directly via secure chat with any questions or concerns.     Edrick Kins, DPM [facility]  Dr. Edrick Kins, DPM     [address] [place], [place] [address]                Office [phone]  Fax [phone]

## 2022-09-09 NOTE — Consult Note (Signed)
Vascular and Vein Specialist of [facility]  Patient name: Evan Nelson MRN: [id number] DOB: 05-Feb-1961 Sex: male   REQUESTING PROVIDER:   Hospital service   REASON FOR CONSULT:    Left foot wound  HISTORY OF PRESENT ILLNESS:   Evan Nelson is a 62 y.o. male, who presented to the [facility] emergency department on 09/08/2022 with pain and swelling of his left foot.  He states that he bumped his toe about 6 months ago and then dropped something on it a few months later and has had a nonhealing wound since that time.  He was treated with antibiotics on February 1.  He is concerned that the toe is turning more black.  The patient is a current smoker.  His blood sugars were elevated and he is being worked up for diabetes.  PAST MEDICAL HISTORY    History reviewed. No pertinent past medical history.   FAMILY HISTORY   History reviewed. No pertinent family history.  SOCIAL HISTORY:   Social History   Socioeconomic History   Marital status: Married    Spouse name: Not on file   Number of children: Not on file   Years of education: Not on file   Highest education level: Not on file  Occupational History   Not on file  Tobacco Use   Smoking status: Every Day    Types: Cigarettes   Smokeless tobacco: Never  Substance and Sexual Activity   Alcohol use: Not Currently   Drug use: Not on file   Sexual activity: Not on file  Other Topics Concern   Not on file  Social History Narrative   Not on file   Social Determinants of Health   Financial Resource Strain: Not on file  Food Insecurity: Not on file  Transportation Needs: Not on file  Physical Activity: Not on file  Stress: Not on file  Social Connections: Not on file  Intimate Partner Violence: Not on file    ALLERGIES:    Allergies  Allergen Reactions   Amoxicillin    Penicillins     CURRENT MEDICATIONS:    Current Facility-Administered Medications  Medication  Dose Route Frequency Provider Last Rate Last Admin   0.9 %  sodium chloride infusion   Intravenous Continuous Davonna Belling, MD 125 mL/hr at 09/09/22 0646 New Bag at 09/09/22 0646   cefTRIAXone (ROCEPHIN) 2 g in sodium chloride 0.9 % 100 mL IVPB  2 g Intravenous Q24H Tat, Shanon Brow, MD 200 mL/hr at 09/09/22 0824 2 g at 09/09/22 0824   enoxaparin (LOVENOX) injection 40 mg  40 mg Subcutaneous Q24H Tat, Shanon Brow, MD       HYDROcodone-acetaminophen (NORCO/VICODIN) 5-325 MG per tablet 1 tablet  1 tablet Oral Q6H PRN Tat, Shanon Brow, MD   1 tablet at 09/09/22 1105   HYDROmorphone (DILAUDID) injection 0.5 mg  0.5 mg Intravenous Q4H PRN Donne Hazel, MD       vancomycin (VANCOREADY) IVPB 1250 mg/250 mL  1,250 mg Intravenous Q24H Tat, Shanon Brow, MD        REVIEW OF SYSTEMS:   [X]$  denotes positive finding, [ ]$  denotes negative finding Cardiac  Comments:  Chest pain or chest pressure: ***   Shortness of breath upon exertion:    Short of breath when lying flat:    Irregular heart rhythm:        Vascular    Pain in calf, thigh, or hip brought on by ambulation:    Pain in feet at night that  wakes you up from your sleep:     Blood clot in your veins:    Leg swelling:         Pulmonary    Oxygen at home:    Productive cough:     Wheezing:         Neurologic    Sudden weakness in arms or legs:     Sudden numbness in arms or legs:     Sudden onset of difficulty speaking or slurred speech:    Temporary loss of vision in one eye:     Problems with dizziness:         Gastrointestinal    Blood in stool:      Vomited blood:         Genitourinary    Burning when urinating:     Blood in urine:        Psychiatric    Major depression:         Hematologic    Bleeding problems:    Problems with blood clotting too easily:        Skin    Rashes or ulcers:        Constitutional    Fever or chills:     PHYSICAL EXAM:   Vitals:   09/08/22 2004 09/08/22 2143 09/09/22 0534 09/09/22 0800  BP: (!)  141/69 135/72 (!) 146/74 109/66  Pulse: 100 98 93 92  Resp: 16 18 18 16  $ Temp: 98 F (36.7 C) 98.2 F (36.8 C) 98.4 F (36.9 C) 98.3 F (36.8 C)  TempSrc: Oral Oral Oral Oral  SpO2: 100% 98% 98% 100%  Weight:      Height:        GENERAL: The patient is a well-nourished male, in no acute distress. The vital signs are documented above. CARDIAC: There is a regular rate and rhythm.  VASCULAR: *** PULMONARY: Nonlabored respirations ABDOMEN: Soft and non-tender with normal pitched bowel sounds.  MUSCULOSKELETAL: There are no major deformities or cyanosis. NEUROLOGIC: No focal weakness or paresthesias are detected. SKIN: See photo below. PSYCHIATRIC: The patient has a normal affect.         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  His white count is normal.  Noninvasive imaging is pending.  He clearly has a nonviable left fifth toe that will be managed by podiatry.  I think that we need to evaluate his blood flow prior to amputation.  I discussed proceeding with angiography and intervention if indicated.  This will be through a right femoral approach.  Details of the procedure were discussed with the patient.  All questions were answered.   Leia Alf, MD, FACS [facility] of [facility] [phone] Pager [phone]

## 2022-09-09 NOTE — Progress Notes (Signed)
Pharmacy Antibiotic Note  Evan Nelson is a 62 y.o. male admitted on 09/08/2022 with  osteomyelitis on L 5th toe .  Pharmacy has been consulted for Vancomycin dosing.  Vanc loading dose given 2/10 pm and empiric regimen to begin tonight. Creatinine improved 1.22 > 0.86.  Plan: Increase Vancomycin 1250 > 1500 mg IV q24h to begin tonight. Goal AUC 400-550. Expected AUC: 462 SCr used: 0.86 and Vd 0.5L/kg for BMI > 30 Also on Ceftriaxone 2gm IV q24h. Follow renal function, culture data, VSS and Podiatry input and treatment plans.   Height: 5' 7"$  (170.2 cm) Weight: 89.4 kg (197 lb) IBW/kg (Calculated) : 66.1  Temp (24hrs), Avg:98.4 F (36.9 C), Min:98 F (36.7 C), Max:98.9 F (37.2 C)  Recent Labs  Lab 09/08/22 1245 09/09/22 0005  WBC 10.4 9.9  CREATININE 1.22 0.86  LATICACIDVEN 1.6  --     Estimated Creatinine Clearance: 93.8 mL/min (by C-G formula based on SCr of 0.86 mg/dL).    Allergies  Allergen Reactions   Amoxicillin    Penicillins     Antimicrobials this admission: Vancomycin 2/10 >> Ceftriaxone 2/10>> Metronidazole x 1 on 2/10  Dose adjustments this admission: 2/11: empiric regimen modified for improved creatinine  Microbiology results: 2/10 blood: no growth < 12 hrs to date  Thank you for allowing pharmacy to be a part of this patient's care.  Arty Baumgartner, [place] 09/09/2022 3:04 PM

## 2022-09-10 ENCOUNTER — Encounter (HOSPITAL_COMMUNITY)
Admission: EM | Disposition: A | Discharge status: Home Health | Payer: Self-pay | Source: Home / Self Care | Attending: Internal Medicine

## 2022-09-10 ENCOUNTER — Inpatient Hospital Stay (HOSPITAL_COMMUNITY): Payer: Medicare PPO

## 2022-09-10 DIAGNOSIS — I70229 Atherosclerosis of native arteries of extremities with rest pain, unspecified extremity: Secondary | ICD-10-CM | POA: Diagnosis not present

## 2022-09-10 DIAGNOSIS — M869 Osteomyelitis, unspecified: Secondary | ICD-10-CM | POA: Diagnosis not present

## 2022-09-10 DIAGNOSIS — I70245 Atherosclerosis of native arteries of left leg with ulceration of other part of foot: Secondary | ICD-10-CM

## 2022-09-10 DIAGNOSIS — I96 Gangrene, not elsewhere classified: Secondary | ICD-10-CM

## 2022-09-10 DIAGNOSIS — M86172 Other acute osteomyelitis, left ankle and foot: Secondary | ICD-10-CM | POA: Diagnosis not present

## 2022-09-10 HISTORY — PX: PERIPHERAL VASCULAR INTERVENTION: CATH118257

## 2022-09-10 HISTORY — PX: PERIPHERAL VASCULAR BALLOON ANGIOPLASTY: CATH118281

## 2022-09-10 HISTORY — PX: ABDOMINAL AORTOGRAM W/LOWER EXTREMITY: CATH118223

## 2022-09-10 LAB — GLUCOSE, CAPILLARY
Glucose-Capillary: 116 mg/dL — ABNORMAL HIGH (ref 70–99)
Glucose-Capillary: 129 mg/dL — ABNORMAL HIGH (ref 70–99)
Glucose-Capillary: 117 mg/dL — ABNORMAL HIGH (ref 70–99)

## 2022-09-10 LAB — BASIC METABOLIC PANEL WITH GFR
Anion gap: 8 (ref 5–15)
BUN: 9 mg/dL (ref 8–23)
CO2: 25 mmol/L (ref 22–32)
Calcium: 9.2 mg/dL (ref 8.9–10.3)
Chloride: 101 mmol/L (ref 98–111)
Creatinine, Ser: 0.84 mg/dL (ref 0.61–1.24)
GFR, Estimated: >60 mL/min
Glucose, Bld: 117 mg/dL — ABNORMAL HIGH (ref 70–99)
Potassium: 4.2 mmol/L (ref 3.5–5.1)
Sodium: 134 mmol/L — ABNORMAL LOW (ref 135–145)

## 2022-09-10 LAB — CBC
HCT: 40.7 % (ref 39.0–52.0)
Hemoglobin: 13.2 g/dL (ref 13.0–17.0)
MCH: 29.4 pg (ref 26.0–34.0)
MCHC: 32.4 g/dL (ref 30.0–36.0)
MCV: 90.6 fL (ref 80.0–100.0)
Platelets: 472 10*3/uL — ABNORMAL HIGH (ref 150–400)
RBC: 4.49 MIL/uL (ref 4.22–5.81)
RDW: 13.7 % (ref 11.5–15.5)
WBC: 9 10*3/uL (ref 4.0–10.5)
nRBC: 0 % (ref 0.0–0.2)

## 2022-09-10 LAB — CULTURE, BLOOD (ROUTINE X 2)

## 2022-09-10 LAB — VAS US ABI WITH/WO TBI
Left ABI: 0.34
Right ABI: 0.82

## 2022-09-10 SURGERY — ABDOMINAL AORTOGRAM W/LOWER EXTREMITY
Anesthesia: LOCAL

## 2022-09-10 MED ORDER — HEPARIN (PORCINE) IN NACL 1000-0.9 UT/500ML-% IV SOLN
INTRAVENOUS | Status: AC
  Filled 2022-09-10: qty 500

## 2022-09-10 MED ORDER — ASPIRIN 81 MG PO CHEW
CHEWABLE_TABLET | ORAL | Status: AC
  Filled 2022-09-10: qty 1

## 2022-09-10 MED ORDER — ATORVASTATIN CALCIUM 40 MG PO TABS
40.0000 mg | ORAL_TABLET | Freq: Every day | ORAL | Status: DC
  Administered 2022-09-10 – 2022-09-13 (×4): 40 mg via ORAL
  Filled 2022-09-10 (×4): qty 1

## 2022-09-10 MED ORDER — ASPIRIN 81 MG PO TBEC
81.0000 mg | DELAYED_RELEASE_TABLET | Freq: Every day | ORAL | Status: DC
  Administered 2022-09-11 – 2022-09-14 (×4): 81 mg via ORAL
  Filled 2022-09-10 (×4): qty 1

## 2022-09-10 MED ORDER — FENTANYL CITRATE (PF) 100 MCG/2ML IJ SOLN
INTRAMUSCULAR | Status: AC
  Filled 2022-09-10: qty 2

## 2022-09-10 MED ORDER — ASPIRIN 81 MG PO CHEW
CHEWABLE_TABLET | ORAL | Status: DC | PRN
  Administered 2022-09-10: 81 mg via ORAL

## 2022-09-10 MED ORDER — HEPARIN SODIUM (PORCINE) 1000 UNIT/ML IJ SOLN
INTRAMUSCULAR | Status: AC
  Filled 2022-09-10: qty 10

## 2022-09-10 MED ORDER — HEPARIN (PORCINE) IN NACL 1000-0.9 UT/500ML-% IV SOLN
INTRAVENOUS | Status: DC | PRN
  Administered 2022-09-10 (×3): 500 mL

## 2022-09-10 MED ORDER — LIDOCAINE HCL (PF) 1 % IJ SOLN
INTRAMUSCULAR | Status: DC | PRN
  Administered 2022-09-10: 15 mL

## 2022-09-10 MED ORDER — SODIUM CHLORIDE 0.9% FLUSH
3.0000 mL | Freq: Two times a day (BID) | INTRAVENOUS | Status: DC
  Administered 2022-09-11 – 2022-09-14 (×4): 3 mL via INTRAVENOUS

## 2022-09-10 MED ORDER — LABETALOL HCL 5 MG/ML IV SOLN: 10.0000 mg | INTRAVENOUS | Status: DC | PRN

## 2022-09-10 MED ORDER — ACETAMINOPHEN 325 MG PO TABS: 650.0000 mg | ORAL_TABLET | ORAL | Status: DC | PRN

## 2022-09-10 MED ORDER — SODIUM CHLORIDE 0.9 % WEIGHT BASED INFUSION
1.0000 mL/kg/h | INTRAVENOUS | Status: AC
  Administered 2022-09-10: 1 mL/kg/h via INTRAVENOUS

## 2022-09-10 MED ORDER — HEPARIN SODIUM (PORCINE) 1000 UNIT/ML IJ SOLN
INTRAMUSCULAR | Status: DC | PRN
  Administered 2022-09-10 (×2): 3000 [IU] via INTRAVENOUS
  Administered 2022-09-10: 5000 [IU] via INTRAVENOUS

## 2022-09-10 MED ORDER — MIDAZOLAM HCL 5 MG/5ML IJ SOLN
INTRAMUSCULAR | Status: AC
  Filled 2022-09-10: qty 5

## 2022-09-10 MED ORDER — CLOPIDOGREL BISULFATE 300 MG PO TABS
ORAL_TABLET | ORAL | Status: DC | PRN
  Administered 2022-09-10: 300 mg via ORAL

## 2022-09-10 MED ORDER — HYDRALAZINE HCL 20 MG/ML IJ SOLN: 5.0000 mg | INTRAMUSCULAR | Status: DC | PRN

## 2022-09-10 MED ORDER — HEPARIN (PORCINE) IN NACL 1000-0.9 UT/500ML-% IV SOLN
INTRAVENOUS | Status: AC
  Filled 2022-09-10: qty 1000

## 2022-09-10 MED ORDER — MIDAZOLAM HCL 2 MG/2ML IJ SOLN
INTRAMUSCULAR | Status: DC | PRN
  Administered 2022-09-10: 1 mg via INTRAVENOUS

## 2022-09-10 MED ORDER — SODIUM CHLORIDE 0.9 % IV SOLN
INTRAVENOUS | Status: AC | PRN
  Administered 2022-09-10: 75 mL/h via INTRAVENOUS

## 2022-09-10 MED ORDER — CLOPIDOGREL BISULFATE 75 MG PO TABS
75.0000 mg | ORAL_TABLET | Freq: Every day | ORAL | Status: DC
  Administered 2022-09-11 – 2022-09-14 (×4): 75 mg via ORAL
  Filled 2022-09-10 (×4): qty 1

## 2022-09-10 MED ORDER — HYDROMORPHONE HCL 1 MG/ML IJ SOLN
1.0000 mg | INTRAMUSCULAR | Status: DC | PRN
  Administered 2022-09-10 – 2022-09-11 (×3): 1 mg via INTRAVENOUS
  Filled 2022-09-10 (×2): qty 1

## 2022-09-10 MED ORDER — SODIUM CHLORIDE 0.9 % IV SOLN: 250.0000 mL | INTRAVENOUS | Status: DC | PRN

## 2022-09-10 MED ORDER — IODIXANOL 320 MG/ML IV SOLN
INTRAVENOUS | Status: DC | PRN
  Administered 2022-09-10: 210 mL

## 2022-09-10 MED ORDER — HYDROCODONE-ACETAMINOPHEN 5-325 MG PO TABS
1.0000 | ORAL_TABLET | ORAL | Status: DC | PRN
  Administered 2022-09-11 – 2022-09-13 (×3): 1 via ORAL
  Filled 2022-09-10 (×6): qty 1

## 2022-09-10 MED ORDER — CLOPIDOGREL BISULFATE 300 MG PO TABS
ORAL_TABLET | ORAL | Status: AC
  Filled 2022-09-10: qty 1

## 2022-09-10 MED ORDER — FENTANYL CITRATE (PF) 100 MCG/2ML IJ SOLN
INTRAMUSCULAR | Status: DC | PRN
  Administered 2022-09-10 (×4): 50 ug via INTRAVENOUS

## 2022-09-10 MED ORDER — SODIUM CHLORIDE 0.9% FLUSH: 3.0000 mL | INTRAVENOUS | Status: DC | PRN

## 2022-09-10 MED ORDER — ONDANSETRON HCL 4 MG/2ML IJ SOLN: 4.0000 mg | Freq: Four times a day (QID) | INTRAMUSCULAR | Status: DC | PRN

## 2022-09-10 MED ORDER — LIDOCAINE HCL (PF) 1 % IJ SOLN
INTRAMUSCULAR | Status: AC
  Filled 2022-09-10: qty 30

## 2022-09-10 SURGICAL SUPPLY — 24 items
BALL STERLING OTW 2.5X100X150 (BALLOONS) ×2
BALLN MUSTANG 6X150X135 (BALLOONS) ×2
BALLN STERLING OTW 2.5X100X150 (BALLOONS) ×2
BALLOON MUSTANG 6X150X135 (BALLOONS) IMPLANT
BALLOON STRLNG OTW 2.5X100X150 (BALLOONS) IMPLANT
CATH 0.018 NAVICROSS ANG 135 (CATHETERS) IMPLANT
CATH OMNI FLUSH 5F 65CM (CATHETERS) IMPLANT
CATH QUICKCROSS SUPP .035X90CM (MICROCATHETER) IMPLANT
CATH SYNTRAX .035X150 (CATHETERS) IMPLANT
DEVICE CLOSURE MYNXGRIP 6/7F (Vascular Products) IMPLANT
GLIDEWIRE ADV .035X260CM (WIRE) IMPLANT
KIT ENCORE 26 ADVANTAGE (KITS) IMPLANT
KIT MICROPUNCTURE NIT STIFF (SHEATH) IMPLANT
KIT PV (KITS) ×2 IMPLANT
SHEATH CATAPULT 6FR 45 (SHEATH) IMPLANT
SHEATH PINNACLE 5F 10CM (SHEATH) IMPLANT
SHEATH PINNACLE 6F 10CM (SHEATH) IMPLANT
STENT ELUVIA 7X150X130 (Permanent Stent) IMPLANT
STENT ELUVIA 7X80X130 (Permanent Stent) IMPLANT
SYR MEDRAD MARK 7 150ML (SYRINGE) ×2 IMPLANT
TRANSDUCER W/STOPCOCK (MISCELLANEOUS) ×2 IMPLANT
TRAY PV CATH (CUSTOM PROCEDURE TRAY) ×2 IMPLANT
WIRE BENTSON .035X145CM (WIRE) IMPLANT
WIRE G V18X300CM (WIRE) IMPLANT

## 2022-09-10 NOTE — Op Note (Signed)
Patient name: Evan Nelson MRN: [id number] DOB: 08/26/1960 Sex: male  09/10/2022 Pre-operative Diagnosis: Left lower extremity critical ischemia with tissue loss of the left fifth toe Post-operative diagnosis:  Same Surgeon:  Broadus John, MD Procedure Performed: 1.  Ultrasound-guided micropuncture access of the right common femoral artery 2.  Aortogram 3.  Second-order cannulation, left lower extremity angiogram 4.  Drug-eluting stenting 7 x 50, 7 x 80 left superficial femoral artery 5.  Tibial artery angioplasty anterior tibial, posterior tibial 2.5 x 100 mm balloon 6.  Moderate sedation time 115 minutes 7.  Contrast volume 210 mL   Indications: Patient is a 62 year old male who presented with left fifth toe dry gangrene, nonpalpable pulses in the left foot.  After discussing the risk and benefits of left lower extremity angiogram in an effort to define and improve distal perfusion for wound healing, Evan Nelson elected to proceed.  Findings:  No flow-limiting stenosis in the aortoiliac segment bilaterally.  Roughly 30% stenosis of the left common iliac artery.  This was nonflow limiting with no change in pullback pressure.  Left lower extremity with widely patent common femoral artery, profunda 1 branch of the profunda was occluded with what appeared to be thrombus. The superficial femoral artery was initially patent, however there was a 20 cm segment occlusion prior to reconstitution at the P2 1 segment of the popliteal artery.  Popliteal artery was patent.  All 3 tibial vessels patent, however there was flow-limiting disease appreciated in the posterior tibial and anterior tibial arteries.  Both continued into the foot as the dorsalis pedis and medial lateral plantar branches.   Procedure:  The patient was identified in the holding area and taken to room 8.  The patient was then placed supine on the table and prepped and draped in the usual sterile fashion.  A time out was called.   Ultrasound was used to evaluate the right common femoral artery.  It was patent .  A digital ultrasound image was acquired.  A micropuncture needle was used to access the right common femoral artery under ultrasound guidance.  An 018 wire was advanced without resistance and a micropuncture sheath was placed.  The 018 wire was removed and a benson wire was placed.  The micropuncture sheath was exchanged for a 5 french sheath.  An omniflush catheter was advanced over the wire to the level of L-1.  An abdominal angiogram was obtained.  Next, using the omniflush catheter and a benson wire, the aortic bifurcation was crossed and the catheter was placed into theleft external iliac artery and left runoff was obtained.  See results above.  I elected to attempt intervention on the left superficial femoral artery, anterior tibial and posterior tibial arteries.  The patient was heparinized and a 6 x 60 cm sheath was brought to the field and parked in the left proximal superficial femoral artery.  From this location, a series of wires and catheters were used to cross the 20 cm SFA occlusion.  True lumen was confirmed distally with use of angiography.  Next, I elected to use 7 x 150, 7 x 80 Eluvia stents.  These were postdilated with a 6 mm balloon.  Follow-up angiography demonstrated excellent result.  Distally, the anterior tibial and posterior tibial vessels were difficult to cannulate due to tortuosity.  Once cannulated, I elected to use a 2.5 x 100 mm balloon which was inflated in succession along the entirety of the anterior tibial artery, by the posterior tibial artery.  Follow-up angiography demonstrated excellent result with brisk runoff into the foot.  Arteriotomy managed with a minx device.  The patient had a palpable dorsalis pedis at case completion. Patient was administered dual antiplatelet therapy.   Impression: Successful recanalization of the superficial femoral artery, balloon angioplasty of the  anterior tibial and posterior tibial arteries.  Patient should be able to heal a toe amputation.   Evan Santee, MD [facility] of [place] Office: [phone]

## 2022-09-10 NOTE — Progress Notes (Signed)
Progress Note   Patient: Evan Nelson [id number] DOB: 1960/10/14 DOA: 09/08/2022     2 DOS: the patient was seen and examined on 09/10/2022   Brief hospital course: 62 year old male with no documented chronic medical history presenting with pain and edema of his left foot.  The patient states that he bumped his little toe about 6 months ago.  He subsequently dropped an object on it a few months later.  He noticed a sore that was not healing.  He subsequently went to see podiatry, Dr. Boneta Lucks, on 08/30/2022.  The patient was given a prescription for norco and doxycycline.  The plan was for the patient to have outpatient vascular studies to be performed.  The patient was also instructed to paint his foot with Betadine.  Unfortunately, the patient pain with his foot particularly with weightbearing.  He stated that it is not particularly any worse, but was concerned that his fifth toe was turning more black.  He continues to complain of swelling and erythema.  He states that they were not any worse than usual.  He denies any fevers, chills, headache, chest pain, shortness breath, cough, hemoptysis, nausea, vomiting, direct abdominal pain.  There is no dysuria, hematuria. In the ED, the patient was afebrile hemodynamically stable with oxygen saturation 96% room air.  WBC 10.4, hemoglobin 15.3, platelets 566,000.  Sodium 135, potassium 3.9, bicarbonate 26, BUN 16, creatinine 1.22.  LFTs were unremarkable.  X-ray of the left foot shows erosive changes of the distal phalanx of the left fifth toe.  There was a small amount of soft tissue air in the left fifth toe distally.  Podiatry, Dr. Daylene Katayama was consulted and recommend transfer to [facility] for evaluation and possible operative intervention.  I also contacted VVS, Dr. Trula Slade who is agreeable for consult.  Assessment and Plan: Acute osteomyelitis left fifth toe/dry gangrene left foot -The patient likely has underlying peripheral vascular  disease -vascular Surgery and Podiatry following -on empiric vancomycin and ceftriaxone -ABI reviewed. Findings worrisome for mild RLE disease, severe LLE disease. F/u on Vascular Surgery recs. Podiatry plan possible surgery after Vascular eval   Hyperglycemia secondary to new diagnosis of DM2 -Nonfasting serum glucose 184 upon arrival -A1c is elevated at 8.6 -Now on SSI coverage as needed -Have consulted Diabetic Coordinator given new diagnosis   Personal History of PCN allergy -occurred 30 yr ago>>hives       Subjective: Without complaints when seen this AM  Physical Exam: Vitals:   09/09/22 0800 09/09/22 1600 09/10/22 0456 09/10/22 0700  BP: 109/66 (!) 163/81 136/74 129/70  Pulse: 92 94 90 89  Resp: 16  18   Temp: 98.3 F (36.8 C) 98.2 F (36.8 C) 98.2 F (36.8 C) 98.3 F (36.8 C)  TempSrc: Oral Oral Oral Oral  SpO2: 100% 100% 99% 100%  Weight:      Height:       General exam: Conversant, in no acute distress Respiratory system: normal chest rise, clear, no audible wheezing Cardiovascular system: regular rhythm, s1-s2 Gastrointestinal system: Nondistended, nontender, pos BS Central nervous system: No seizures, no tremors Extremities: LLE with necrotic 5th digit Skin: No rashes, no pallor Psychiatry: Affect normal // no auditory hallucinations   Data Reviewed:  Labs reviewed: Na 134, K 4.2, Cr 0.84  Family Communication: Pt in room, family not at bedside  Disposition: Status is: Inpatient Remains inpatient appropriate because: Severity of illness  Planned Discharge Destination:  Unclear at this time  Author: Marylu Lund, MD 09/10/2022 1:32 PM  For on call review [email or web address].

## 2022-09-10 NOTE — Progress Notes (Signed)
  Daily Progress Note   Subjective: Cli with tissue loss, nonpalpable pulse  Objective: Vitals:   09/10/22 0700 09/10/22 1344  BP: 129/70   Pulse: 89   Resp:    Temp: 98.3 F (36.8 C)   SpO2: 100% 99%    Physical Examination Nonpalpable distal pulses Nonlabored breathing.  Normotensive Normal rate  ASSESSMENT/PLAN:  Pt with LLE Cli with tissue loss.  After discussing the risk and benefits of left lower extremity angiogram in an effort to define and possibly improve perfusion for wound healing, Evan Nelson elected to proceed.   Cassandria Santee MD MS [facility] [phone] 09/10/2022  1:55 PM

## 2022-09-10 NOTE — Progress Notes (Signed)
Patient arrived for a brief moment to wait on report and to move to their inpatient bed.

## 2022-09-10 NOTE — Progress Notes (Signed)
VASCULAR LAB    ABI has been performed.  See CV proc for preliminary results.   Kang Ishida, RVT 09/10/2022, 12:23 PM

## 2022-09-11 ENCOUNTER — Inpatient Hospital Stay (HOSPITAL_COMMUNITY): Payer: Medicare PPO

## 2022-09-11 ENCOUNTER — Encounter (HOSPITAL_COMMUNITY): Payer: Self-pay | Admitting: Vascular Surgery

## 2022-09-11 ENCOUNTER — Encounter (HOSPITAL_COMMUNITY)
Admission: EM | Disposition: A | Discharge status: Home Health | Payer: Self-pay | Source: Home / Self Care | Attending: Internal Medicine

## 2022-09-11 ENCOUNTER — Inpatient Hospital Stay (HOSPITAL_COMMUNITY): Payer: Medicare PPO | Admitting: Certified Registered"

## 2022-09-11 DIAGNOSIS — M86172 Other acute osteomyelitis, left ankle and foot: Secondary | ICD-10-CM | POA: Diagnosis not present

## 2022-09-11 DIAGNOSIS — M869 Osteomyelitis, unspecified: Secondary | ICD-10-CM | POA: Diagnosis not present

## 2022-09-11 DIAGNOSIS — F17210 Nicotine dependence, cigarettes, uncomplicated: Secondary | ICD-10-CM | POA: Diagnosis not present

## 2022-09-11 HISTORY — PX: AMPUTATION TOE: SHX6595

## 2022-09-11 LAB — COMPREHENSIVE METABOLIC PANEL
ALT: 29 U/L (ref 0–44)
AST: 25 U/L (ref 15–41)
Albumin: 2.8 g/dL — ABNORMAL LOW (ref 3.5–5.0)
Alkaline Phosphatase: 75 U/L (ref 38–126)
Anion gap: 11 (ref 5–15)
BUN: 9 mg/dL (ref 8–23)
CO2: 24 mmol/L (ref 22–32)
Calcium: 9.1 mg/dL (ref 8.9–10.3)
Chloride: 97 mmol/L — ABNORMAL LOW (ref 98–111)
Creatinine, Ser: 0.94 mg/dL (ref 0.61–1.24)
GFR, Estimated: >60 mL/min (ref >60)
Glucose, Bld: 103 mg/dL — ABNORMAL HIGH (ref 70–99)
Potassium: 4 mmol/L (ref 3.5–5.1)
Sodium: 132 mmol/L — ABNORMAL LOW (ref 135–145)
Total Bilirubin: 0.6 mg/dL (ref 0.3–1.2)
Total Protein: 6.8 g/dL (ref 6.5–8.1)

## 2022-09-11 LAB — GLUCOSE, CAPILLARY
Glucose-Capillary: 104 mg/dL — ABNORMAL HIGH (ref 70–99)
Glucose-Capillary: 100 mg/dL — ABNORMAL HIGH (ref 70–99)
Glucose-Capillary: 111 mg/dL — ABNORMAL HIGH (ref 70–99)
Glucose-Capillary: 201 mg/dL — ABNORMAL HIGH (ref 70–99)

## 2022-09-11 LAB — CBC
HCT: 39.6 % (ref 39.0–52.0)
Hemoglobin: 13.3 g/dL (ref 13.0–17.0)
MCH: 29.7 pg (ref 26.0–34.0)
MCHC: 33.6 g/dL (ref 30.0–36.0)
MCV: 88.4 fL (ref 80.0–100.0)
Platelets: 456 10*3/uL — ABNORMAL HIGH (ref 150–400)
RBC: 4.48 MIL/uL (ref 4.22–5.81)
RDW: 13.5 % (ref 11.5–15.5)
WBC: 9.2 10*3/uL (ref 4.0–10.5)
nRBC: 0 % (ref 0.0–0.2)

## 2022-09-11 LAB — LIPID PANEL
Cholesterol: 155 mg/dL (ref 0–200)
HDL: 28 mg/dL — ABNORMAL LOW (ref >40)
LDL Cholesterol: 112 mg/dL — ABNORMAL HIGH (ref 0–99)
Total CHOL/HDL Ratio: 5.5 RATIO
Triglycerides: 76 mg/dL (ref <150)
VLDL: 15 mg/dL (ref 0–40)

## 2022-09-11 LAB — SURGICAL PCR SCREEN
MRSA, PCR: NEGATIVE
Staphylococcus aureus: POSITIVE — AB

## 2022-09-11 LAB — CULTURE, BLOOD (ROUTINE X 2): Culture: NO GROWTH

## 2022-09-11 SURGERY — AMPUTATION, TOE
Anesthesia: Monitor Anesthesia Care | Site: Toe | Laterality: Left

## 2022-09-11 MED ORDER — HYDROMORPHONE HCL 1 MG/ML IJ SOLN: 0.2500 mg | INTRAMUSCULAR | Status: DC | PRN

## 2022-09-11 MED ORDER — MIDAZOLAM HCL 2 MG/2ML IJ SOLN
INTRAMUSCULAR | Status: DC | PRN
  Administered 2022-09-11: 2 mg via INTRAVENOUS

## 2022-09-11 MED ORDER — LIDOCAINE HCL 2 % IJ SOLN
INTRAMUSCULAR | Status: DC | PRN
  Administered 2022-09-11: 14 mL

## 2022-09-11 MED ORDER — CHLORHEXIDINE GLUCONATE CLOTH 2 % EX PADS
6.0000 | MEDICATED_PAD | Freq: Once | CUTANEOUS | Status: AC
  Administered 2022-09-11: 6 via TOPICAL

## 2022-09-11 MED ORDER — ACETAMINOPHEN 10 MG/ML IV SOLN
1000.0000 mg | Freq: Once | INTRAVENOUS | Status: DC | PRN
  Administered 2022-09-11: 1000 mg via INTRAVENOUS

## 2022-09-11 MED ORDER — AMISULPRIDE (ANTIEMETIC) 5 MG/2ML IV SOLN: 10.0000 mg | Freq: Once | INTRAVENOUS | Status: DC | PRN

## 2022-09-11 MED ORDER — ACETAMINOPHEN 10 MG/ML IV SOLN
INTRAVENOUS | Status: AC
  Filled 2022-09-11: qty 100

## 2022-09-11 MED ORDER — PHENYLEPHRINE 80 MCG/ML (10ML) SYRINGE FOR IV PUSH (FOR BLOOD PRESSURE SUPPORT)
PREFILLED_SYRINGE | INTRAVENOUS | Status: DC | PRN
  Administered 2022-09-11: 50 ug via INTRAVENOUS

## 2022-09-11 MED ORDER — HYDROMORPHONE HCL 1 MG/ML IJ SOLN
1.0000 mg | Freq: Four times a day (QID) | INTRAMUSCULAR | Status: DC | PRN
  Administered 2022-09-11 – 2022-09-13 (×9): 1 mg via INTRAVENOUS
  Filled 2022-09-11 (×9): qty 1

## 2022-09-11 MED ORDER — MUPIROCIN 2 % EX OINT
1.0000 | TOPICAL_OINTMENT | Freq: Two times a day (BID) | CUTANEOUS | Status: DC
  Administered 2022-09-11 – 2022-09-14 (×7): 1 via NASAL
  Filled 2022-09-11: qty 22

## 2022-09-11 MED ORDER — MIDAZOLAM HCL 2 MG/2ML IJ SOLN
INTRAMUSCULAR | Status: AC
  Filled 2022-09-11: qty 2

## 2022-09-11 MED ORDER — LACTATED RINGERS IV SOLN: INTRAVENOUS | Status: DC

## 2022-09-11 MED ORDER — PROPOFOL 500 MG/50ML IV EMUL
INTRAVENOUS | Status: DC | PRN
  Administered 2022-09-11: 50 ug/kg/min via INTRAVENOUS

## 2022-09-11 MED ORDER — LIDOCAINE 2% (20 MG/ML) 5 ML SYRINGE
INTRAMUSCULAR | Status: DC | PRN
  Administered 2022-09-11: 100 mg via INTRAVENOUS

## 2022-09-11 MED ORDER — HYDROMORPHONE HCL 1 MG/ML IJ SOLN
INTRAMUSCULAR | Status: AC
  Filled 2022-09-11: qty 1

## 2022-09-11 MED ORDER — MEPERIDINE HCL 25 MG/ML IJ SOLN: 6.2500 mg | INTRAMUSCULAR | Status: DC | PRN

## 2022-09-11 MED ORDER — OXYCODONE HCL 5 MG PO TABS
ORAL_TABLET | ORAL | Status: AC
  Filled 2022-09-11: qty 1

## 2022-09-11 MED ORDER — CHLORHEXIDINE GLUCONATE 0.12 % MT SOLN: 15.0000 mL | OROMUCOSAL | Status: AC

## 2022-09-11 MED ORDER — CHLORHEXIDINE GLUCONATE 0.12 % MT SOLN
OROMUCOSAL | Status: AC
  Administered 2022-09-11: 15 mL via OROMUCOSAL
  Filled 2022-09-11: qty 15

## 2022-09-11 MED ORDER — FENTANYL CITRATE (PF) 250 MCG/5ML IJ SOLN
INTRAMUSCULAR | Status: AC
  Filled 2022-09-11: qty 5

## 2022-09-11 MED ORDER — 0.9 % SODIUM CHLORIDE (POUR BTL) OPTIME
TOPICAL | Status: DC | PRN
  Administered 2022-09-11: 1000 mL

## 2022-09-11 MED ORDER — LIDOCAINE HCL (PF) 1 % IJ SOLN
INTRAMUSCULAR | Status: AC
  Filled 2022-09-11: qty 30

## 2022-09-11 MED ORDER — OXYCODONE HCL 5 MG PO TABS
5.0000 mg | ORAL_TABLET | Freq: Once | ORAL | Status: AC
  Administered 2022-09-11: 5 mg via ORAL

## 2022-09-11 MED ORDER — FENTANYL CITRATE (PF) 250 MCG/5ML IJ SOLN
INTRAMUSCULAR | Status: DC | PRN
  Administered 2022-09-11: 100 ug via INTRAVENOUS

## 2022-09-11 MED ORDER — PROMETHAZINE HCL 25 MG/ML IJ SOLN: 6.2500 mg | INTRAMUSCULAR | Status: DC | PRN

## 2022-09-11 MED ORDER — CHLORHEXIDINE GLUCONATE CLOTH 2 % EX PADS
6.0000 | MEDICATED_PAD | Freq: Every day | CUTANEOUS | Status: DC
  Administered 2022-09-12 – 2022-09-14 (×3): 6 via TOPICAL

## 2022-09-11 SURGICAL SUPPLY — 39 items
BAG COUNTER SPONGE SURGICOUNT (BAG) ×1 IMPLANT
BAG SPNG CNTER NS LX DISP (BAG) ×1
BLADE LONG MED 31X9 (MISCELLANEOUS) IMPLANT
BNDG CMPR 9X4 STRL LF SNTH (GAUZE/BANDAGES/DRESSINGS)
BNDG CONFORM 2 STRL LF (GAUZE/BANDAGES/DRESSINGS) ×1 IMPLANT
BNDG ELASTIC 3X5.8 VLCR STR LF (GAUZE/BANDAGES/DRESSINGS) ×1 IMPLANT
BNDG ESMARK 4X9 LF (GAUZE/BANDAGES/DRESSINGS) ×1 IMPLANT
BNDG GAUZE DERMACEA FLUFF 4 (GAUZE/BANDAGES/DRESSINGS) ×1 IMPLANT
BNDG GZE DERMACEA 4 6PLY (GAUZE/BANDAGES/DRESSINGS) ×1
COVER SURGICAL LIGHT HANDLE (MISCELLANEOUS) IMPLANT
CUFF TOURN SGL QUICK 18X4 (TOURNIQUET CUFF) IMPLANT
DRSG EMULSION OIL 3X3 NADH (GAUZE/BANDAGES/DRESSINGS) ×1 IMPLANT
DRSG XEROFORM 1X8 (GAUZE/BANDAGES/DRESSINGS) IMPLANT
DURAPREP 26ML APPLICATOR (WOUND CARE) ×1 IMPLANT
ELECT REM PT RETURN 9FT ADLT (ELECTROSURGICAL) ×1
ELECTRODE REM PT RTRN 9FT ADLT (ELECTROSURGICAL) ×1 IMPLANT
GAUZE PACKING IODOFORM 1/2INX (GAUZE/BANDAGES/DRESSINGS) IMPLANT
GAUZE SPONGE 4X4 12PLY STRL (GAUZE/BANDAGES/DRESSINGS) ×1 IMPLANT
GLOVE BIO SURGEON STRL SZ8 (GLOVE) ×2 IMPLANT
GOWN STRL REUS W/ TWL LRG LVL3 (GOWN DISPOSABLE) ×1 IMPLANT
GOWN STRL REUS W/ TWL XL LVL3 (GOWN DISPOSABLE) ×1 IMPLANT
GOWN STRL REUS W/TWL LRG LVL3 (GOWN DISPOSABLE) ×1
GOWN STRL REUS W/TWL XL LVL3 (GOWN DISPOSABLE) ×1
KIT BASIN OR (CUSTOM PROCEDURE TRAY) ×1 IMPLANT
NDL HYPO 25GX1X1/2 BEV (NEEDLE) IMPLANT
NDL HYPO 25X1 1.5 SAFETY (NEEDLE) ×1 IMPLANT
NEEDLE HYPO 25GX1X1/2 BEV (NEEDLE) ×1 IMPLANT
NEEDLE HYPO 25X1 1.5 SAFETY (NEEDLE) ×1 IMPLANT
NS IRRIG 1000ML POUR BTL (IV SOLUTION) IMPLANT
PACK ORTHO EXTREMITY (CUSTOM PROCEDURE TRAY) ×1 IMPLANT
PAD ABD 8X10 STRL (GAUZE/BANDAGES/DRESSINGS) IMPLANT
SUCTION FRAZIER HANDLE 10FR (MISCELLANEOUS) ×1
SUCTION TUBE FRAZIER 10FR DISP (MISCELLANEOUS) ×1 IMPLANT
SUT PROLENE 3 0 PS 2 (SUTURE) ×1 IMPLANT
SYR 10ML LL (SYRINGE) IMPLANT
SYR CONTROL 10ML LL (SYRINGE) IMPLANT
TUBE CONNECTING 12X1/4 (SUCTIONS) ×1 IMPLANT
UNDERPAD 30X36 HEAVY ABSORB (UNDERPADS AND DIAPERS) ×1 IMPLANT
YANKAUER SUCT BULB TIP NO VENT (SUCTIONS) IMPLANT

## 2022-09-11 NOTE — Progress Notes (Signed)
PHARMACIST LIPID MONITORING   Evan Nelson is a 62 y.o. 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 Pharmacy has been consulted to optimize lipid-lowering therapy with the indication of secondary prevention for clinical ASCVD.  Recent Labs:  Lipid Panel (last 6 months):   Lab Results  Component Value Date   CHOL 155 09/11/2022   TRIG 76 09/11/2022   HDL 28 (L) 09/11/2022   CHOLHDL 5.5 09/11/2022   VLDL 15 09/11/2022   LDLCALC 112 (H) 09/11/2022    Hepatic function panel (last 6 months):   Lab Results  Component Value Date   AST 25 09/11/2022   ALT 29 09/11/2022   ALKPHOS 75 09/11/2022   BILITOT 0.6 09/11/2022    SCr (since admission):   Serum creatinine: 0.94 mg/dL 09/11/22 0433 Estimated creatinine clearance: 85.8 mL/min  Current therapy and lipid therapy tolerance Current lipid-lowering therapy: none Previous lipid-lowering therapies (if applicable): none Documented or reported allergies or intolerances to lipid-lowering therapies (if applicable): n/a  Assessment:   Patient agrees with changes to lipid-lowering therapy  Plan:    1.Statin intensity (high intensity recommended for all patients regardless of the LDL):  Add or increase statin to high intensity.  2.Add ezetimibe (if any one of the following):   Not indicated at this time.  3.Refer to lipid clinic:   No  4.Follow-up with:  Cardiology provider - None  5.Follow-up labs after discharge:  Changes in lipid therapy were made. Check a lipid panel in 8-12 weeks then annually.      Thank you for allowing pharmacy to be a part of this patient's care.  Ardyth Harps, PharmD Clinical Pharmacist

## 2022-09-11 NOTE — Brief Op Note (Signed)
09/08/2022 - 09/11/2022  1:33 PM  PATIENT:  Sharin Mons  62 y.o. male  PRE-OPERATIVE DIAGNOSIS:  Left fifth digit osteomyelitis  POST-OPERATIVE DIAGNOSIS:  * No post-op diagnosis entered *  PROCEDURE:  Procedure(s): AMPUTATION TOE, fifth (Left)  SURGEON:  Surgeon(s) and Role:    Lorenda Peck, DPM - Primary  PHYSICIAN ASSISTANT:   ASSISTANTS: none   ANESTHESIA:   local and MAC  EBL: 10 ml   BLOOD ADMINISTERED:none  DRAINS: none   LOCAL MEDICATIONS USED:  LIDOCAINE  and Amount: 14 ml  SPECIMEN:  Source of Specimen:  Left fifth digit  DISPOSITION OF SPECIMEN:   pathology and culture swab of residual wound  COUNTS:  YES  TOURNIQUET:  * Missing tourniquet times found for documented tourniquets in log: [id number] *  DICTATION: .Note written in EPIC  PLAN OF CARE: Admit to inpatient   PATIENT DISPOSITION:  PACU - hemodynamically stable.   Delay start of Pharmacological VTE agent (>24hrs) due to surgical blood loss or risk of bleeding: no

## 2022-09-11 NOTE — Progress Notes (Signed)
Inpatient Diabetes Program Recommendations  AACE/ADA: New Consensus Statement on Inpatient Glycemic Control (2015)  Target Ranges:  Prepandial:   less than 140 mg/dL      Peak postprandial:   less than 180 mg/dL (1-2 hours)      Critically ill patients:  140 - 180 mg/dL   Lab Results  Component Value Date   GLUCAP 100 (H) 09/11/2022   HGBA1C 8.6 (H) 09/09/2022    Review of Glycemic Control  Latest Reference Range & Units 09/10/22 07:55 09/10/22 17:26 09/10/22 21:26 09/11/22 06:06 09/11/22 11:57  Glucose-Capillary 70 - 99 mg/dL 116 (H) 129 (H) 117 (H) 104 (H) 100 (H)   Diabetes history: DM - New diagnosis Outpatient Diabetes medications:  None Current orders for Inpatient glycemic control:  Novolog 0-15 units tid with meals and HS  Inpatient Diabetes Program Recommendations:    Blood sugars are well controlled.  Patient is in OR- Will f/u on 2/14 regarding new diagnosis of DM (A1C=8.6%).    Thanks,  Adah Perl, RN, BC-ADM Inpatient Diabetes Coordinator Pager [phone]  (8a-5p)

## 2022-09-11 NOTE — Progress Notes (Signed)
Progress Note   Patient: Evan Nelson [id number] DOB: [birth year] DOA: 09/08/2022     3 DOS: the patient was seen and examined on 09/11/2022   Brief hospital course: 62 year old male with no documented chronic medical history presenting with pain and edema of his left foot.  The patient states that he bumped his little toe about 6 months ago.  He subsequently dropped an object on it a few months later.  He noticed a sore that was not healing.  He subsequently went to see podiatry, Dr. Boneta Lucks, on 08/30/2022.  The patient was given a prescription for norco and doxycycline.  The plan was for the patient to have outpatient vascular studies to be performed.  The patient was also instructed to paint his foot with Betadine.  Unfortunately, the patient pain with his foot particularly with weightbearing.  He stated that it is not particularly any worse, but was concerned that his fifth toe was turning more black.  He continues to complain of swelling and erythema.  He states that they were not any worse than usual.  He denies any fevers, chills, headache, chest pain, shortness breath, cough, hemoptysis, nausea, vomiting, direct abdominal pain.  There is no dysuria, hematuria. In the ED, the patient was afebrile hemodynamically stable with oxygen saturation 96% room air.  WBC 10.4, hemoglobin 15.3, platelets 566,000.  Sodium 135, potassium 3.9, bicarbonate 26, BUN 16, creatinine 1.22.  LFTs were unremarkable.  X-ray of the left foot shows erosive changes of the distal phalanx of the left fifth toe.  There was a small amount of soft tissue air in the left fifth toe distally.  Podiatry, Dr. Daylene Katayama was consulted and recommend transfer to [facility] for evaluation and possible operative intervention.  I also contacted [facility], Dr. Trula Slade who is agreeable for consult.  Assessment and Plan: Acute osteomyelitis left fifth toe/dry gangrene left foot -The patient likely has underlying peripheral vascular  disease -vascular Surgery and Podiatry following -on empiric vancomycin and ceftriaxone -ABI reviewed. Findings worrisome for mild RLE disease, severe LLE disease.  -Pt s/p stenting to L superficial femoral artery and angioplasty to anterior and posterior tibial arteries 2/12 -Podiatry planning surgery later today   Hyperglycemia secondary to new diagnosis of DM2 -Nonfasting serum glucose 184 upon arrival -A1c is elevated at 8.6 -Now on SSI coverage as needed -Have consulted Diabetic Coordinator given new diagnosis   Personal History of PCN allergy -occurred 30 yr ago>>hives       Subjective: No complaints today. Eager to have surgery later today  Physical Exam: Vitals:   09/10/22 2300 09/11/22 0339 09/11/22 0719 09/11/22 0816  BP: 109/79 (!) 150/80 (!) 160/82 (!) 159/71  Pulse:  92 (!) 104 93  Resp: 19 (!) 21 18 (!) 24  Temp: 98.4 F (36.9 C) 98.4 F (36.9 C) 98.1 F (36.7 C)   TempSrc: Oral Oral Oral   SpO2: 96% 97% 98% 99%  Weight:      Height:       General exam: Awake, laying in bed, in nad Respiratory system: Normal respiratory effort, no wheezing Cardiovascular system: regular rate, s1, s2 Gastrointestinal system: Soft, nondistended, positive BS Central nervous system: CN2-12 grossly intact, strength intact Extremities: LLE with post-op dressings in place Skin: Normal skin turgor, no notable skin lesions seen Psychiatry: Mood normal // no visual hallucinations   Data Reviewed:  Labs reviewed: Na 132, K 4.0, Cr 0.94  Family Communication: Pt in room, family not at bedside  Disposition: Status  is: Inpatient Remains inpatient appropriate because: Severity of illness  Planned Discharge Destination:  Unclear at this time     Author: Marylu Lund, MD 09/11/2022 11:53 AM  For on call review [email or web address].

## 2022-09-11 NOTE — Interval H&P Note (Signed)
History and Physical Interval Note:  09/11/2022 1:31 PM  Evan Nelson  has presented today for surgery, with the diagnosis of Left fifth digit osteomyelitis.  The various methods of treatment have been discussed with the patient and family. After consideration of risks, benefits and other options for treatment, the patient has consented to  Procedure(s): AMPUTATION TOE, fifth (Left) as a surgical intervention.  The patient's history has been reviewed, patient examined, no change in status, stable for surgery.  I have reviewed the patient's chart and labs.  Questions were answered to the patient's satisfaction.     Lorenda Peck

## 2022-09-11 NOTE — Progress Notes (Addendum)
  Progress Note    09/11/2022 6:40 AM 1 Day Post-Op  Subjective:  no complaints this morning  afebrile  Vitals:   09/10/22 2300 09/11/22 0339  BP: 109/79 (!) 150/80  Pulse:  92  Resp: 19 (!) 21  Temp: 98.4 F (36.9 C) 98.4 F (36.9 C)  SpO2: 96% 97%    Physical Exam: General:  resting and in no distress Cardiac:  regular Lungs:  non labored Incisions:  right groin is soft without hematoma Extremities:  brisk left PT doppler flow   CBC    Component Value Date/Time   WBC 9.2 09/11/2022 0433   RBC 4.48 09/11/2022 0433   HGB 13.3 09/11/2022 0433   HCT 39.6 09/11/2022 0433   PLT 456 (H) 09/11/2022 0433   MCV 88.4 09/11/2022 0433   MCH 29.7 09/11/2022 0433   MCHC 33.6 09/11/2022 0433   RDW 13.5 09/11/2022 0433   LYMPHSABS 3.0 09/08/2022 1245   MONOABS 0.6 09/08/2022 1245   EOSABS 0.2 09/08/2022 1245   BASOSABS 0.1 09/08/2022 1245    BMET    Component Value Date/Time   NA 132 (L) 09/11/2022 0433   K 4.0 09/11/2022 0433   CL 97 (L) 09/11/2022 0433   CO2 24 09/11/2022 0433   GLUCOSE 103 (H) 09/11/2022 0433   BUN 9 09/11/2022 0433   CREATININE 0.94 09/11/2022 0433   CALCIUM 9.1 09/11/2022 0433   GFRNONAA >60 09/11/2022 0433    INR No results found for: "INR"   Intake/Output Summary (Last 24 hours) at 09/11/2022 0640 Last data filed at 09/11/2022 0501 Gross per 24 hour  Intake 3725.59 ml  Output 300 ml  Net 3425.59 ml      Assessment/Plan:  62 y.o. male is s/p:  Angiogram with drug eluding stent left SFA, tibial artery angioplasty (AT, PT) vial right CFA  1 Day Post-Op   -brisk doppler flow left PT; left foot is warm and well perfused.  Should be able to heal toe amputation. -for OR today with podiatry -f/u with [facility] in 4-6 weeks with ABI and LLE arterial duplex -DVT prophylaxis:  Lovenox -f/u in 4-6 weeks with [facility] with LLE arterial duplex and ABI -continue asa/statin/plavix   Leontine Locket, PA-C [facility] [phone] 09/11/2022 6:40 AM  I agree with the above.  POD#1.  He has a palpable PT pulse.  Groin site without hematoma.  For toe amputation.  We will sign off.  He has follow up in 4-6 weeks.  Keep on ASA/Plavix/statin  Annamarie Major

## 2022-09-11 NOTE — Op Note (Signed)
OPERATIVE REPORT Patient name: Evan Nelson MRN: [id number] DOB: 1960-10-09  DOS:  09/11/22  Preop Dx: Osteomyelitis of fifth toe of left foot ([name]) Postop Dx: same  Procedure:  1. Left fifth digit amputation with irrigation and debridement  Surgeon: Lorenda Peck, DPM  Anesthesia: 50-50 mixture of 2% lidocaine plain with 0.5% Marcaine plain totaling 10    infiltrated in the patient's left lower extremity  Hemostasis: No TQ necessary   EBL: 10 mL Materials: n/a Injectables: 10 cc lidocaine plain 1%  Pathology: left fifth digit   Condition: The patient tolerated the procedure and anesthesia well. No complications noted or reported   Justification for procedure: The patient is a 62 y.o. male  who presents today for surgical treatment of left fifth digit gangrene and osteomyelitis. Vascularly optimized. All conservative modalities of been unsuccessful in providing any sort of satisfactory alleviation of symptoms with the patient. The patient was told benefits as well as possible side effects of the surgery. The patient consented for surgical correction. The patient consent form was reviewed. All patient questions were answered. No guarantees were expressed or implied. The patient and the surgeon both signed the patient consent form with the witness present and placed in the patient's chart.   Procedure in Detail: The patient was brought to the operating room, placed in the operating table in the supine position at which time an aseptic scrub and drape were performed about the patient's respective lower extremity after anesthesia was induced as described above. Attention was then directed to the surgical area where procedure number one commenced.  Procedure #1:   Attention was directed to the left fifth digit were an incision was preformed encircling the base of the toe. Incision was made down to bone and digit was amputated at the level of the metatarsophalangeal joint careful to  resect any necrotic tissue. After the toe was disarticulated it was passed off to the back table to be sent to pathology for further evaluation. Extensive amount of necrosis and about 10 cc of purulence was noted trackin proximally to the fifth metatarsal.The extensor and flexor tendons were identified and resected as far proximally as possible. Any necrotic tissue was removed to healthy bleeding tissue. The metatarsal head was identified and noted very soft and disintegrated distally. A rongeur was used to resect most of the head then a sagital saw was used to resect to healthy hard bone. . The area was irrigated copiously sterile saline and metatasal bone was sent to microbiology. Any bleeders noted were cauterized as necessary. The skin was re-approximated using  a plantar flap and closed utilizing 3-0 nylon suture. The distal aspect of the inicision was left open and area was packed with 1/2 iodoform.    Dry sterile compressive dressings were then applied to all previously mentioned incision sites about the patient's lower extremity. The patient was then transferred from the operating room to the recovery room having tolerated the procedure and anesthesia well. All vital signs are stable. After a brief stay in the recovery room the patient was readmitted to the floor.    Disposition:  Patient with more proximal infection noted upon debridement .  Distal incision was packed open and will plan to remove this packing tomorrow. Will follow cultures. Podiatry will continue to follow.     Lorenda Peck, DPM [facility]  Dr. Lorenda Peck, DPM    [address]  [place], [place] [address]                Office [phone]  Fax [phone]   

## 2022-09-11 NOTE — Progress Notes (Signed)
  Subjective:  Patient ID: Evan Nelson, male    DOB: 07/19/1961,  MRN: [id number]  Patient seen at bedside this morning s/p left lower extremity angiogram with angioplasty  and stent placement. He relates he is doing well. Interested about next steps.   History reviewed. No pertinent past medical history.   Past Surgical History:  Procedure Laterality Date   ABDOMINAL AORTOGRAM W/LOWER EXTREMITY N/A 09/10/2022   Procedure: ABDOMINAL AORTOGRAM W/LOWER EXTREMITY;  Surgeon: Robins, Joshua E, MD;  Location: [facility];  Service: Cardiovascular;  Laterality: N/A;   PERIPHERAL VASCULAR BALLOON ANGIOPLASTY Left 09/10/2022   Procedure: PERIPHERAL VASCULAR BALLOON ANGIOPLASTY;  Surgeon: Robins, Joshua E, MD;  Location: [facility];  Service: Cardiovascular;  Laterality: Left;  Tibials   PERIPHERAL VASCULAR INTERVENTION Left 09/10/2022   Procedure: PERIPHERAL VASCULAR INTERVENTION;  Surgeon: Robins, Joshua E, MD;  Location: [facility];  Service: Cardiovascular;  Laterality: Left;  SFA       Latest Ref Rng & Units 09/11/2022    4:33 AM 09/10/2022    7:56 AM 09/09/2022   12:05 AM  CBC  WBC 4.0 - 10.5 K/uL 9.2  9.0  9.9   Hemoglobin 13.0 - 17.0 g/dL 13.3  13.2  13.8   Hematocrit 39.0 - 52.0 % 39.6  40.7  41.2   Platelets 150 - 400 K/uL 456  472  488        Latest Ref Rng & Units 09/11/2022    4:33 AM 09/10/2022    7:56 AM 09/09/2022   12:05 AM  BMP  Glucose 70 - 99 mg/dL 103  117  146   BUN 8 - 23 mg/dL 9  9  14   Creatinine 0.61 - 1.24 mg/dL 0.94  0.84  0.86   Sodium 135 - 145 mmol/L 132  134  135   Potassium 3.5 - 5.1 mmol/L 4.0  4.2  4.1   Chloride 98 - 111 mmol/L 97  101  102   CO2 22 - 32 mmol/L 24  25  22   Calcium 8.9 - 10.3 mg/dL 9.1  9.2  9.0      Objective:   Vitals:   09/11/22 0339 09/11/22 0719  BP: (!) 150/80 (!) 160/82  Pulse: 92 (!) 104  Resp: (!) 21 18  Temp: 98.4 F (36.9 C) 98.1 F (36.7 C)  SpO2: 97% 98%    General:AA&O x 3. Normal  mood and affect   Vascular: DP and PT pulses 2/4 bilateral. Brisk capillary refill to all digits. Pedal hair present   Neruological. Epicritic sensation grossly intact.   Derm: Necrotic left fifth digit with necrosis extending over dorsum of fifth MPJ.   MSK: MMT 5/5 in dorsiflexion, plantar flexion, inversion and eversion. Normal joint ROM without pain or crepitus.        Assessment & Plan:  Patient was evaluated and treated and all questions answered.  DX: Left fifth digit osteomyelitis and gangrene S/p angiogram and angioplasty and cleared by vascular for toe amputation.  Discussed amputation of the left fifth digit and patient in agreement with plan.  Will plan for OR this afternoon.  NPO currently.   My Rinke, DPM  Accessible via secure chat for questions or concerns.  

## 2022-09-11 NOTE — H&P (View-Only) (Signed)
  Subjective:  Patient ID: Evan Nelson, male    DOB: 1960-10-28,  MRN: [id number]  Patient seen at bedside this morning s/p left lower extremity angiogram with angioplasty  and stent placement. He relates he is doing well. Interested about next steps.   History reviewed. No pertinent past medical history.   Past Surgical History:  Procedure Laterality Date   ABDOMINAL AORTOGRAM W/LOWER EXTREMITY N/A 09/10/2022   Procedure: ABDOMINAL AORTOGRAM W/LOWER EXTREMITY;  Surgeon: Broadus John, MD;  Location: [facility];  Service: Cardiovascular;  Laterality: N/A;   PERIPHERAL VASCULAR BALLOON ANGIOPLASTY Left 09/10/2022   Procedure: PERIPHERAL VASCULAR BALLOON ANGIOPLASTY;  Surgeon: Broadus John, MD;  Location: [facility];  Service: Cardiovascular;  Laterality: Left;  Tibials   PERIPHERAL VASCULAR INTERVENTION Left 09/10/2022   Procedure: PERIPHERAL VASCULAR INTERVENTION;  Surgeon: Broadus John, MD;  Location: [facility];  Service: Cardiovascular;  Laterality: Left;  SFA       Latest Ref Rng & Units 09/11/2022    4:33 AM 09/10/2022    7:56 AM 09/09/2022   12:05 AM  CBC  WBC 4.0 - 10.5 K/uL 9.2  9.0  9.9   Hemoglobin 13.0 - 17.0 g/dL 13.3  13.2  13.8   Hematocrit 39.0 - 52.0 % 39.6  40.7  41.2   Platelets 150 - 400 K/uL 456  472  488        Latest Ref Rng & Units 09/11/2022    4:33 AM 09/10/2022    7:56 AM 09/09/2022   12:05 AM  BMP  Glucose 70 - 99 mg/dL 103  117  146   BUN 8 - 23 mg/dL 9  9  14    Creatinine 0.61 - 1.24 mg/dL 0.94  0.84  0.86   Sodium 135 - 145 mmol/L 132  134  135   Potassium 3.5 - 5.1 mmol/L 4.0  4.2  4.1   Chloride 98 - 111 mmol/L 97  101  102   CO2 22 - 32 mmol/L 24  25  22    Calcium 8.9 - 10.3 mg/dL 9.1  9.2  9.0      Objective:   Vitals:   09/11/22 0339 09/11/22 0719  BP: (!) 150/80 (!) 160/82  Pulse: 92 (!) 104  Resp: (!) 21 18  Temp: 98.4 F (36.9 C) 98.1 F (36.7 C)  SpO2: 97% 98%    General:AA&O x 3. Normal  mood and affect   Vascular: DP and PT pulses 2/4 bilateral. Brisk capillary refill to all digits. Pedal hair present   Neruological. Epicritic sensation grossly intact.   Derm: Necrotic left fifth digit with necrosis extending over dorsum of fifth MPJ.   MSK: MMT 5/5 in dorsiflexion, plantar flexion, inversion and eversion. Normal joint ROM without pain or crepitus.        Assessment & Plan:  Patient was evaluated and treated and all questions answered.  DX: Left fifth digit osteomyelitis and gangrene S/p angiogram and angioplasty and cleared by vascular for toe amputation.  Discussed amputation of the left fifth digit and patient in agreement with plan.  Will plan for OR this afternoon.  NPO currently.   Lorenda Peck, DPM  Accessible via secure chat for questions or concerns.

## 2022-09-11 NOTE — Transfer of Care (Signed)
Immediate Anesthesia Transfer of Care Note  Patient: Evan Nelson  Procedure(s) Performed: AMPUTATION LEFT FIFTH TOE (Left: Toe)  Patient Location: PACU  Anesthesia Type:MAC  Level of Consciousness: drowsy and patient cooperative  Airway & Oxygen Therapy: Patient Spontanous Breathing and Patient connected to face mask oxygen  Post-op Assessment: Report given to RN and Post -op Vital signs reviewed and stable  Post vital signs: Reviewed and stable  Last Vitals:  Vitals Value Taken Time  BP 102/53 09/11/22 1430  Temp 36.5 C 09/11/22 1430  Pulse 119 09/11/22 1432  Resp 43 09/11/22 1432  SpO2 95 % 09/11/22 1432  Vitals shown include unvalidated device data.  Last Pain:  Vitals:   09/11/22 1159  TempSrc: Oral  PainSc:       Patients Stated Pain Goal: 0 ([id number])  Complications: No notable events documented.

## 2022-09-11 NOTE — Discharge Instructions (Signed)
° °  [facility] ° °Discharge Instructions ° °Lower Extremity Angiogram; Angioplasty/Stenting ° °Please refer to the following instructions for your post-procedure care. Your surgeon or physician assistant will discuss any changes with you. ° °Activity ° °Avoid lifting more than 8 pounds (1 gallons of milk) for 72 hours (3 days) after your procedure. You may walk as much as you can tolerate. It's OK to drive after 72 hours. ° °Bathing/Showering ° °You may shower the day after your procedure. If you have a bandage, you may remove it at 24- 48 hours. Clean your incision site with mild soap and water. Pat the area dry with a clean towel. ° °Diet ° °Resume your pre-procedure diet. There are no special food restrictions following this procedure. All patients with peripheral vascular disease should follow a low fat/low cholesterol diet. In order to heal from your surgery, it is CRITICAL to get adequate nutrition. Your body requires vitamins, minerals, and protein. Vegetables are the best source of vitamins and minerals. Vegetables also provide the perfect balance of protein. Processed food has little nutritional value, so try to avoid this. ° °Medications ° °Resume taking all of your medications unless your doctor tells you not to. If your incision is causing pain, you may take over-the-counter pain relievers such as acetaminophen (Tylenol) ° °Follow Up ° °Follow up will be arranged at the time of your procedure. You may have an office visit scheduled or may be scheduled for surgery. Ask your surgeon if you have any questions. ° °Please call us immediately for any of the following conditions: °•Severe or worsening pain your legs or feet at rest or with walking. °•Increased pain, redness, drainage at your groin puncture site. °•Fever of 101 degrees or higher. °•If you have any mild or slow bleeding from your puncture site: lie down, apply firm constant pressure over the area with a piece of  gauze or a clean wash cloth for 30 minutes- no peeking!, call 911 right away if you are still bleeding after 30 minutes, or if the bleeding is heavy and unmanageable. ° °Reduce your risk factors of vascular disease: ° °Stop smoking. If you would like help call [facility] at 1-800-QUIT-NOW (1-800-784-8669) or [place] at 336-586-4000. °Manage your cholesterol °Maintain a desired weight °Control your diabetes °Keep your blood pressure down ° °If you have any questions, please call the office at [phone] ° °

## 2022-09-11 NOTE — Anesthesia Preprocedure Evaluation (Addendum)
Anesthesia Evaluation  Patient identified by MRN, date of birth, ID band Patient awake    Reviewed: Allergy & Precautions, NPO status , Patient's Chart, lab work & pertinent test results  Airway Mallampati: II  TM Distance: >3 FB Neck ROM: Full    Dental  (+) Edentulous Upper, Edentulous Lower   Pulmonary Current Smoker and Patient abstained from smoking.    + decreased breath sounds      Cardiovascular negative cardio ROS  Rhythm:Regular Rate:Normal     Neuro/Psych negative neurological ROS  negative psych ROS   GI/Hepatic negative GI ROS, Neg liver ROS,,,  Endo/Other  negative endocrine ROS    Renal/GU negative Renal ROS     Musculoskeletal negative musculoskeletal ROS (+)    Abdominal   Peds  Hematology negative hematology ROS (+)   Anesthesia Other Findings   Reproductive/Obstetrics                             Anesthesia Physical Anesthesia Plan  ASA: 3  Anesthesia Plan: MAC   Post-op Pain Management: Minimal or no pain anticipated   Induction: Intravenous  PONV Risk Score and Plan: 1 and Propofol infusion and Ondansetron  Airway Management Planned: Natural Airway and Simple Face Mask  Additional Equipment: None  Intra-op Plan:   Post-operative Plan:   Informed Consent: I have reviewed the patients History and Physical, chart, labs and discussed the procedure including the risks, benefits and alternatives for the proposed anesthesia with the patient or authorized representative who has indicated his/her understanding and acceptance.       Plan Discussed with: CRNA  Anesthesia Plan Comments:        Anesthesia Quick Evaluation

## 2022-09-12 ENCOUNTER — Encounter (HOSPITAL_COMMUNITY): Payer: Self-pay | Admitting: Podiatry

## 2022-09-12 DIAGNOSIS — M86172 Other acute osteomyelitis, left ankle and foot: Secondary | ICD-10-CM | POA: Diagnosis not present

## 2022-09-12 LAB — COMPREHENSIVE METABOLIC PANEL
ALT: 27 U/L (ref 0–44)
AST: 25 U/L (ref 15–41)
Albumin: 2.9 g/dL — ABNORMAL LOW (ref 3.5–5.0)
Alkaline Phosphatase: 78 U/L (ref 38–126)
Anion gap: 12 (ref 5–15)
BUN: 11 mg/dL (ref 8–23)
CO2: 22 mmol/L (ref 22–32)
Calcium: 9 mg/dL (ref 8.9–10.3)
Chloride: 97 mmol/L — ABNORMAL LOW (ref 98–111)
Creatinine, Ser: 0.88 mg/dL (ref 0.61–1.24)
GFR, Estimated: >60 mL/min (ref >60)
Glucose, Bld: 107 mg/dL — ABNORMAL HIGH (ref 70–99)
Potassium: 4.1 mmol/L (ref 3.5–5.1)
Sodium: 131 mmol/L — ABNORMAL LOW (ref 135–145)
Total Bilirubin: 0.6 mg/dL (ref 0.3–1.2)
Total Protein: 6.8 g/dL (ref 6.5–8.1)

## 2022-09-12 LAB — GLUCOSE, CAPILLARY
Glucose-Capillary: 120 mg/dL — ABNORMAL HIGH (ref 70–99)
Glucose-Capillary: 211 mg/dL — ABNORMAL HIGH (ref 70–99)
Glucose-Capillary: 204 mg/dL — ABNORMAL HIGH (ref 70–99)
Glucose-Capillary: 130 mg/dL — ABNORMAL HIGH (ref 70–99)
Glucose-Capillary: 145 mg/dL — ABNORMAL HIGH (ref 70–99)

## 2022-09-12 LAB — CBC
HCT: 40.7 % (ref 39.0–52.0)
Hemoglobin: 13.7 g/dL (ref 13.0–17.0)
MCH: 29.6 pg (ref 26.0–34.0)
MCHC: 33.7 g/dL (ref 30.0–36.0)
MCV: 87.9 fL (ref 80.0–100.0)
Platelets: 413 10*3/uL — ABNORMAL HIGH (ref 150–400)
RBC: 4.63 MIL/uL (ref 4.22–5.81)
RDW: 13.5 % (ref 11.5–15.5)
WBC: 10.4 10*3/uL (ref 4.0–10.5)
nRBC: 0 % (ref 0.0–0.2)

## 2022-09-12 LAB — CULTURE, BLOOD (ROUTINE X 2): Special Requests: ADEQUATE

## 2022-09-12 MED ORDER — LIVING WELL WITH DIABETES BOOK
Freq: Once | Status: AC
  Filled 2022-09-12: qty 1

## 2022-09-12 NOTE — Inpatient Diabetes Management (Signed)
Inpatient Diabetes Program Recommendations  AACE/ADA: New Consensus Statement on Inpatient Glycemic Control (2015)  Target Ranges:  Prepandial:   less than 140 mg/dL      Peak postprandial:   less than 180 mg/dL (1-2 hours)      Critically ill patients:  140 - 180 mg/dL   Lab Results  Component Value Date   GLUCAP 204 (H) 09/12/2022   HGBA1C 8.6 (H) 09/09/2022    Review of Glycemic Control  Latest Reference Range & Units 09/11/22 21:07 09/12/22 06:07 09/12/22 11:18 09/12/22 11:39  Glucose-Capillary 70 - 99 mg/dL 201 (H) 120 (H) 211 (H) 204 (H)  (H): Data is abnormally high Diabetes history: DM - New diagnosis Outpatient Diabetes medications:  None Current orders for Inpatient glycemic control:  Novolog 0-15 units tid with meals and HS   Inpatient Diabetes Program Recommendations:    When appropriate: Consider adding Metformin 500 mg BID.   Spoke with patient regarding new onset DM. Ordered LWWDM booklet.  Reviewed patient's current A1c of 8.6%. Explained what a A1c is and what it measures. Also reviewed goal A1c with patient, importance of good glucose control @ home, and blood sugar goals. Reviewed patho of diabetes, role of pancreas, survival skills, interventions, side effects/benefits of Metformin, vascular changes and commorbidities.  Will need glucose meter when discharged. Encouraged to begin practicing while inpatient. Reviewed recommended frequency and when to see PCP. Patient is planning to make appointment.  Reports that he avoids sugary beverages and consumes large amounts of baked fish for protein; tries to be mindful already. Reviewed plate method, encouraged continued mindfulness and briefly discussed carb counting.  No further questions at this time.   Thanks, Bronson Curb, MSN, RNC-OB Diabetes Coordinator [phone] (8a-5p)

## 2022-09-12 NOTE — Anesthesia Postprocedure Evaluation (Signed)
Anesthesia Post Note  Patient: Evan Nelson  Procedure(s) Performed: AMPUTATION LEFT FIFTH TOE (Left: Toe)     Patient location during evaluation: PACU Anesthesia Type: MAC Level of consciousness: awake and alert Pain management: pain level controlled Vital Signs Assessment: post-procedure vital signs reviewed and stable Respiratory status: spontaneous breathing, nonlabored ventilation, respiratory function stable and patient connected to nasal cannula oxygen Cardiovascular status: stable and blood pressure returned to baseline Postop Assessment: no apparent nausea or vomiting Anesthetic complications: no  No notable events documented.  Last Vitals:  Vitals:   09/12/22 1200 09/12/22 1600  BP: 131/66 (!) 143/85  Pulse:  (!) 107  Resp:  18  Temp:  36.9 C  SpO2: 98% 96%    Last Pain:  Vitals:   09/12/22 1714  TempSrc:   PainSc: Asleep   Pain Goal: Patients Stated Pain Goal: 0 (09/12/22 1643)                 Phyllicia Dudek L Dwayna Kentner

## 2022-09-12 NOTE — Progress Notes (Signed)
PROGRESS NOTE    Evan Nelson  [id number] DOB: 05/17/1961 DOA: 09/08/2022 PCP: Pcp, No    Brief Narrative:  62 year old gentleman presented with pain and edema of the left foot, apparently had some trauma 6 months ago.  Seen by podiatry as outpatient.  Recently treated with antibiotics.  Continues to complain of pain, swelling and increasing erythema so brought to the hospital.  In the emergency room hemodynamically stable.  X-ray of the left foot with erosive changes of the distal phalanx of the left fifth toe.  Also seen by vascular surgery.   Assessment & Plan:  Acute osteomyelitis of left fifth toe/dry gangrene of the left foot, underlying severe peripheral vascular disease: ABI with mild right lower extremity disease, severe left lower extremity disease. Status post stenting of the left superficial femoral artery and angioplasty to anterior and posterior tibial arteries 2/12. Podiatry surgery, left fifth toe amputation, incomplete evacuation of pus and more proximal pus noted as per surgical notes.  May need additional debridement. Surgical cultures 2/10, 2/13 no growth so far. Patient remains on ceftriaxone and vancomycin.  Will continue until amputation to clean margin. After revascularization patient remains on aspirin, atorvastatin and Plavix.  Newly diagnosed type 2 diabetes: Blood sugars elevated on presentation.  A1c 8.6.  Now on sliding scale coverage.  Will discuss about oral hypoglycemics on discharge.   DVT prophylaxis: enoxaparin (LOVENOX) injection 40 mg Start: 09/08/22 2230   Code Status: Full code Family Communication: None at bedside Disposition Plan: Status is: Inpatient Remains inpatient appropriate because: Immediate postop, further inpatient surgeries anticipated.     Consultants:  Vascular surgery Podiatry  Procedures:  Angiogram 2/12 Left fifth toe disarticulation, wound left open and packed 2/13  Antimicrobials:  Rocephin, vancomycin  2/10---   Subjective: Patient seen and examined.  Denies any complaints.  No other overnight events.  He is aware about possible incomplete surgery and may need more surgeries. Telemetry monitor shows occasional sinus tachycardia, patient without any palpitations or shortness of breath.  Objective: Vitals:   09/12/22 0448 09/12/22 0735 09/12/22 0800 09/12/22 1143  BP: (!) 141/81     Pulse:  (!) 105 (!) 104 (!) 108  Resp: 20 16 16 20  $ Temp: 98 F (36.7 C) 98.7 F (37.1 C)  98.4 F (36.9 C)  TempSrc: Oral Oral  Oral  SpO2: 96%     Weight:      Height:        Intake/Output Summary (Last 24 hours) at 09/12/2022 1246 Last data filed at 09/12/2022 0616 Gross per 24 hour  Intake 1531.65 ml  Output 1135 ml  Net 396.65 ml   Filed Weights   09/08/22 1233 09/11/22 1331  Weight: 89.4 kg 89.4 kg    Examination:  General exam: Appears calm and comfortable  Respiratory system: Clear to auscultation. Respiratory effort normal. Cardiovascular system: S1 & S2 heard, tachycardic. Gastrointestinal system: Abdomen is nondistended, soft and nontender. No organomegaly or masses felt. Normal bowel sounds heard. Central nervous system: Alert and oriented. No focal neurological deficits. Extremities: Left foot on postop dressing, not removed by me.    Data Reviewed: I have personally reviewed following labs and imaging studies  CBC: Recent Labs  Lab 09/08/22 1245 09/09/22 0005 09/10/22 0756 09/11/22 0433 09/12/22 0129  WBC 10.4 9.9 9.0 9.2 10.4  NEUTROABS 6.5  --   --   --   --   HGB 15.3 13.8 13.2 13.3 13.7  HCT 47.0 41.2 40.7 39.6 40.7  MCV  89.9 89.0 90.6 88.4 87.9  PLT 566* 488* 472* 456* [id number]*   Basic Metabolic Panel: Recent Labs  Lab 09/08/22 1245 09/09/22 0005 09/10/22 0756 09/11/22 0433 09/12/22 0129  NA 135 135 134* 132* 131*  K 3.9 4.1 4.2 4.0 4.1  CL 96* 102 101 97* 97*  CO2 26 22 25 24 22  $ GLUCOSE 184* 146* 117* 103* 107*  BUN 16 14 9 9 11  $ CREATININE 1.22  0.86 0.84 0.94 0.88  CALCIUM 9.8 9.0 9.2 9.1 9.0   GFR: Estimated Creatinine Clearance: 91.6 mL/min (by C-G formula based on SCr of 0.88 mg/dL). Liver Function Tests: Recent Labs  Lab 09/08/22 1245 09/11/22 0433 09/12/22 0129  AST 22 25 25  $ ALT 30 29 27  $ ALKPHOS 87 75 78  BILITOT 0.5 0.6 0.6  PROT 8.5* 6.8 6.8  ALBUMIN 3.7 2.8* 2.9*   No results for input(s): "LIPASE", "AMYLASE" in the last 168 hours. No results for input(s): "AMMONIA" in the last 168 hours. Coagulation Profile: No results for input(s): "INR", "PROTIME" in the last 168 hours. Cardiac Enzymes: No results for input(s): "CKTOTAL", "CKMB", "CKMBINDEX", "TROPONINI" in the last 168 hours. BNP (last 3 results) No results for input(s): "PROBNP" in the last 8760 hours. HbA1C: No results for input(s): "HGBA1C" in the last 72 hours. CBG: Recent Labs  Lab 09/11/22 1604 09/11/22 2107 09/12/22 0607 09/12/22 1118 09/12/22 1139  GLUCAP 111* 201* 120* 211* 204*   Lipid Profile: Recent Labs    09/11/22 0433  CHOL 155  HDL 28*  LDLCALC 112*  TRIG 76  CHOLHDL 5.5   Thyroid Function Tests: No results for input(s): "TSH", "T4TOTAL", "FREET4", "T3FREE", "THYROIDAB" in the last 72 hours. Anemia Panel: No results for input(s): "VITAMINB12", "FOLATE", "FERRITIN", "TIBC", "IRON", "RETICCTPCT" in the last 72 hours. Sepsis Labs: Recent Labs  Lab 09/08/22 1245  LATICACIDVEN 1.6    Recent Results (from the past 240 hour(s))  Blood Cultures x 2 sites     Status: None (Preliminary result)   Collection Time: 09/08/22  1:26 PM   Specimen: Right Antecubital; Blood  Result Value Ref Range Status   Specimen Description   Final    RIGHT ANTECUBITAL BOTTLES DRAWN AEROBIC AND ANAEROBIC   Special Requests   Final    Blood Culture results may not be optimal due to an excessive volume of blood received in culture bottles   Culture   Final    NO GROWTH 4 DAYS Performed at [facility], [address], [place], [place]  [address]    Report Status PENDING  Incomplete  Blood Cultures x 2 sites     Status: None (Preliminary result)   Collection Time: 09/08/22  1:26 PM   Specimen: BLOOD RIGHT HAND  Result Value Ref Range Status   Specimen Description BLOOD RIGHT HAND AEROBIC BOTTLE ONLY  Final   Special Requests Blood Culture adequate volume  Final   Culture   Final    NO GROWTH 4 DAYS Performed at [facility], [address], [place], [place] [address]    Report Status PENDING  Incomplete  Surgical pcr screen     Status: Abnormal   Collection Time: 09/11/22  5:52 AM   Specimen: Nasal Mucosa; Nasal Swab  Result Value Ref Range Status   MRSA, PCR NEGATIVE NEGATIVE Final   Staphylococcus aureus POSITIVE (A) NEGATIVE Final    Comment: (NOTE) The Xpert SA Assay (FDA approved for NASAL specimens in patients 44 years of age and older), is one component  of a comprehensive surveillance program. It is not intended to diagnose infection nor to guide or monitor treatment. Performed at [facility], [place] [address], [place], [place] [address]   Aerobic/Anaerobic Culture w Gram Stain (surgical/deep wound)     Status: None (Preliminary result)   Collection Time: 09/11/22  2:07 PM   Specimen: Wound  Result Value Ref Range Status   Specimen Description WOUND LEFT TOE  Final   Special Requests FIFTH TOE  Final   Gram Stain NO WBC SEEN NO ORGANISMS SEEN   Final   Culture   Final    CULTURE REINCUBATED FOR BETTER GROWTH Performed at [facility], [address] [place], [place] [address]    Report Status PENDING  Incomplete         Radiology Studies: DG Foot Complete Left  Result Date: 09/11/2022 CLINICAL DATA:  Osteomyelitis of the foot, postop exam EXAM: LEFT FOOT - COMPLETE 3+ VIEW COMPARISON:  09/08/2022 FINDINGS: Interval transmetatarsal amputation of the fifth digit. Gas in the soft tissues consistent with recent surgery. No fracture. IMPRESSION: Interval transmetatarsal amputation of the  fifth digit with expected postsurgical changes. Electronically Signed   By: Donavan Foil M.D.   On: 09/11/2022 15:16   PERIPHERAL VASCULAR CATHETERIZATION  Result Date: 09/10/2022 Images from the original result were not included.   Patient name: Evan Nelson           MRN: [id number]        DOB: [birth year]          Sex: male  09/10/2022 Pre-operative Diagnosis: Left lower extremity critical ischemia with tissue loss of the left fifth toe Post-operative diagnosis:  Same Surgeon:  Broadus John, MD Procedure Performed: 1.  Ultrasound-guided micropuncture access of the right common femoral artery 2.  Aortogram 3.  Second-order cannulation, left lower extremity angiogram 4.  Drug-eluting stenting 7 x 50, 7 x 80 left superficial femoral artery 5.  Tibial artery angioplasty anterior tibial, posterior tibial 2.5 x 100 mm balloon 6.  Moderate sedation time 115 minutes 7.  Contrast volume 210 mL   Indications: Patient is a 62 year old male who presented with left fifth toe dry gangrene, nonpalpable pulses in the left foot.  After discussing the risk and benefits of left lower extremity angiogram in an effort to define and improve distal perfusion for wound healing, Konrad elected to proceed.  Findings: No flow-limiting stenosis in the aortoiliac segment bilaterally.  Roughly 30% stenosis of the left common iliac artery.  This was nonflow limiting with no change in pullback pressure.  Left lower extremity with widely patent common femoral artery, profunda 1 branch of the profunda was occluded with what appeared to be thrombus. The superficial femoral artery was initially patent, however there was a 20 cm segment occlusion prior to reconstitution at the P2 1 segment of the popliteal artery.  Popliteal artery was patent.  All 3 tibial vessels patent, however there was flow-limiting disease appreciated in the posterior tibial and anterior tibial arteries.  Both continued into the foot as the dorsalis pedis and medial  lateral plantar branches.             Procedure:  The patient was identified in the holding area and taken to room 8.  The patient was then placed supine on the table and prepped and draped in the usual sterile fashion.  A time out was called.  Ultrasound was used to evaluate the right common femoral artery.  It  was patent .  A digital ultrasound image was acquired.  A micropuncture needle was used to access the right common femoral artery under ultrasound guidance.  An 018 wire was advanced without resistance and a micropuncture sheath was placed.  The 018 wire was removed and a benson wire was placed.  The micropuncture sheath was exchanged for a 5 french sheath.  An omniflush catheter was advanced over the wire to the level of L-1.  An abdominal angiogram was obtained.  Next, using the omniflush catheter and a benson wire, the aortic bifurcation was crossed and the catheter was placed into theleft external iliac artery and left runoff was obtained.  See results above.  I elected to attempt intervention on the left superficial femoral artery, anterior tibial and posterior tibial arteries.  The patient was heparinized and a 6 x 60 cm sheath was brought to the field and parked in the left proximal superficial femoral artery.  From this location, a series of wires and catheters were used to cross the 20 cm SFA occlusion.  True lumen was confirmed distally with use of angiography.  Next, I elected to use 7 x 150, 7 x 80 Eluvia stents.  These were postdilated with a 6 mm balloon.  Follow-up angiography demonstrated excellent result.  Distally, the anterior tibial and posterior tibial vessels were difficult to cannulate due to tortuosity.  Once cannulated, I elected to use a 2.5 x 100 mm balloon which was inflated in succession along the entirety of the anterior tibial artery, by the posterior tibial artery.  Follow-up angiography demonstrated excellent result with brisk runoff into the foot.  Arteriotomy managed with a  minx device.  The patient had a palpable dorsalis pedis at case completion. Patient was administered dual antiplatelet therapy.   Impression: Successful recanalization of the superficial femoral artery, balloon angioplasty of the anterior tibial and posterior tibial arteries.  Patient should be able to heal a toe amputation.   Cassandria [place]ee, MD [facility] Office: [phone]        Scheduled Meds:  aspirin EC  81 mg Oral Daily   atorvastatin  40 mg Oral QHS   Chlorhexidine Gluconate Cloth  6 each Topical Q0600   clopidogrel  75 mg Oral Q breakfast   enoxaparin (LOVENOX) injection  40 mg Subcutaneous Q24H   insulin aspart  0-15 Units Subcutaneous TID WC   insulin aspart  0-5 Units Subcutaneous QHS   mupirocin ointment  1 Application Nasal BID   sodium chloride flush  3 mL Intravenous Q12H   Continuous Infusions:  sodium chloride 75 mL/hr at 09/11/22 0435   sodium chloride     cefTRIAXone (ROCEPHIN)  IV 2 g (09/12/22 0834)   lactated ringers     vancomycin 1,500 mg (09/11/22 1714)     LOS: 4 days    Time spent: 35 minutes    Barb Merino, MD [facility] Pager [phone]

## 2022-09-12 NOTE — Progress Notes (Signed)
Subjective: 1 Day Post-Op Procedure(s) (LRB): AMPUTATION LEFT FIFTH TOE (Left) DOS: 09/11/2022  POD #1.  Patient resting comfortably in bed.  Dressings are intact.  Denies N/V/F/CP/SOB  Objective: Vital signs in last 24 hours: Temp:  [98 F (36.7 C)-98.7 F (37.1 C)] 98.4 F (36.9 C) (02/14 1600) Pulse Rate:  [104-108] 107 (02/14 1600) Resp:  [16-20] 18 (02/14 1600) BP: (131-146)/(66-85) 143/85 (02/14 1600) SpO2:  [96 %-98 %] 96 % (02/14 1600)   Recent Labs    09/10/22 0756 09/11/22 0433 09/12/22 0129  HGB 13.2 13.3 13.7   Recent Labs    09/11/22 0433 09/12/22 0129  WBC 9.2 10.4  RBC 4.48 4.63  HCT 39.6 40.7  PLT 456* 413*   Recent Labs    09/11/22 0433 09/12/22 0129  NA 132* 131*  K 4.0 4.1  CL 97* 97*  CO2 24 22  BUN 9 11  CREATININE 0.94 0.88  GLUCOSE 103* 107*  CALCIUM 9.1 9.0   Sutures intact.  Iodoform packing within the amputation site noted.  Sanguinous drainage.  No active bleeding.  Overall the amputation site appears stable   Assessment/Plan: 1 Day Post-Op Procedure(s) (LRB): AMPUTATION LEFT FIFTH TOE (Left) DOS: 09/11/2022 S/p Angiogram with drug eluding stent left SFA, tibial artery angioplasty (AT, PT) vial right CFA.  DOS: 09/10/2022  -Packing removed.  Dressings reapplied -Overall amputation site appears stable. -Cultures pending.  Currently no WBC seen.  No organisms seen. -Continue Rocephin + vancomycin IV -Partial weightbearing heel touch only left foot with the assistance of a walker -Will follow  Edrick Kins 09/12/2022, 7:38 PM  Edrick Kins, DPM [facility]  Dr. Edrick Kins, DPM    [address], [place] [address]                Office [phone]  Fax [phone]

## 2022-09-12 NOTE — Care Management Important Message (Signed)
Important Message  Patient Details  Name: Evan Nelson MRN: [id number] Date of Birth: 1961-03-06   Medicare Important Message Given:  Yes     Hannah Beat 09/12/2022, 2:12 PM

## 2022-09-13 DIAGNOSIS — M86172 Other acute osteomyelitis, left ankle and foot: Secondary | ICD-10-CM | POA: Diagnosis not present

## 2022-09-13 LAB — GLUCOSE, CAPILLARY
Glucose-Capillary: 126 mg/dL — ABNORMAL HIGH (ref 70–99)
Glucose-Capillary: 99 mg/dL (ref 70–99)
Glucose-Capillary: 122 mg/dL — ABNORMAL HIGH (ref 70–99)
Glucose-Capillary: 144 mg/dL — ABNORMAL HIGH (ref 70–99)
Glucose-Capillary: 169 mg/dL — ABNORMAL HIGH (ref 70–99)

## 2022-09-13 LAB — SURGICAL PATHOLOGY

## 2022-09-13 LAB — CULTURE, BLOOD (ROUTINE X 2)

## 2022-09-13 MED ORDER — DOCUSATE SODIUM 100 MG PO CAPS
100.0000 mg | ORAL_CAPSULE | Freq: Two times a day (BID) | ORAL | Status: DC
  Administered 2022-09-13 – 2022-09-14 (×3): 100 mg via ORAL
  Filled 2022-09-13 (×3): qty 1

## 2022-09-13 MED ORDER — AMOXICILLIN 500 MG PO CAPS
500.0000 mg | ORAL_CAPSULE | Freq: Once | ORAL | Status: AC
  Administered 2022-09-13: 500 mg via ORAL
  Filled 2022-09-13: qty 1

## 2022-09-13 MED ORDER — HYDROCODONE-ACETAMINOPHEN 5-325 MG PO TABS
2.0000 | ORAL_TABLET | ORAL | Status: DC | PRN
  Administered 2022-09-13 – 2022-09-14 (×6): 2 via ORAL
  Filled 2022-09-13 (×6): qty 2

## 2022-09-13 MED ORDER — DIPHENHYDRAMINE HCL 50 MG/ML IJ SOLN: 25.0000 mg | Freq: Once | INTRAMUSCULAR | Status: DC | PRN

## 2022-09-13 MED ORDER — EPINEPHRINE 0.3 MG/0.3ML IJ SOAJ
0.3000 mg | Freq: Once | INTRAMUSCULAR | Status: DC | PRN
  Filled 2022-09-13: qty 0.3

## 2022-09-13 NOTE — Progress Notes (Signed)
Subjective: 2 Days Post-Op Procedure(s) (LRB): AMPUTATION LEFT FIFTH TOE (Left) DOS: 09/11/2022  POD #2.  Patient resting comfortably in bed.  Dressings are intact.  Denies N/V/F/CP/SOB  Objective: Vital signs in last 24 hours: Temp:  [97.6 F (36.4 C)-98.5 F (36.9 C)] 97.9 F (36.6 C) (02/15 1128) Pulse Rate:  [100-107] 104 (02/15 0747) Resp:  [15-20] 17 (02/15 1128) BP: (133-151)/(70-85) 147/79 (02/15 1128) SpO2:  [96 %-98 %] 98 % (02/15 1128)   Recent Labs    09/11/22 0433 09/12/22 0129  HGB 13.3 13.7    Recent Labs    09/11/22 0433 09/12/22 0129  WBC 9.2 10.4  RBC 4.48 4.63  HCT 39.6 40.7  PLT 456* 413*    Recent Labs    09/11/22 0433 09/12/22 0129  NA 132* 131*  K 4.0 4.1  CL 97* 97*  CO2 24 22  BUN 9 11  CREATININE 0.94 0.88  GLUCOSE 103* 107*  CALCIUM 9.1 9.0    Sutures intact.  Iodoform packing within the amputation site noted.  Sanguinous drainage.  No active bleeding.  Overall the amputation site appears stable   Assessment/Plan: 2 Days Post-Op Procedure(s) (LRB): AMPUTATION LEFT FIFTH TOE (Left) DOS: 09/11/2022 S/p Angiogram with drug eluding stent left SFA, tibial artery angioplasty (AT, PT) vial right CFA.  DOS: 09/10/2022  -Packing removed.  Dressings reapplied - Patient will need Betadine wet-to-dry dressing changes once a day nursing dressing change orders were placed - He is okay to be discharged from podiatric standpoint.  Follow-up 1 week from discharge. -Antibiotics per culture -No further or plans at this time  Felipa Furnace 09/13/2022, 3:47 PM    [address], [place] [address]                Office [phone]  Fax [phone]

## 2022-09-13 NOTE — Social Work (Signed)
CSW received message from  Retail banker , patient's sister, [place], requested a call. CSW spoke with the patient's sister. She informed the patient lives home alone with limited support. She expressed she wants to make sure things are" in place "for the patient if the disposition is to return home. She states he will need walker, HH, etc. She implied patient may decline some services but he may not understand  how important it is to have " things in place". CSW explained, patient will be seen by PT/OT , recommendations will be made and the patient will be seen a TOC member. She states understanding.  Thurmond Butts, MSW, LCSW Clinical Social Worker

## 2022-09-13 NOTE — Progress Notes (Signed)
Administered p.o. amoxicillin following up with VS q30mn for 1hr as per pharmacy recommendation due to his history of rxn of hives with pcn >10 yrs ago. Pt tolerating amoxicillin well thus far.

## 2022-09-13 NOTE — Progress Notes (Signed)
PROGRESS NOTE    Evan Nelson  [id number] DOB: 18-Oct-1960 DOA: 09/08/2022 PCP: Pcp, No    Brief Narrative:  62 year old gentleman presented with pain and edema of the left foot, apparently had some trauma 6 months ago.  Seen by podiatry as outpatient.  Recently treated with antibiotics.  Continues to complain of pain, swelling and increasing erythema so brought to the hospital.  In the emergency room hemodynamically stable.  X-ray of the left foot with erosive changes of the distal phalanx of the left fifth toe.  Also seen by vascular surgery.   Assessment & Plan:  Acute osteomyelitis of left fifth toe/dry gangrene of the left foot, underlying severe peripheral vascular disease: ABI with mild right lower extremity disease, severe left lower extremity disease. Status post stenting of the left superficial femoral artery and angioplasty to anterior and posterior tibial arteries 2/12. Podiatry surgery, left fifth toe amputation, incomplete evacuation of pus and more proximal pus noted as per surgical notes.  May need additional debridement. Surgical cultures 2/10, 2/13 no growth so far. Patient remains on ceftriaxone and vancomycin.  Will continue until surgically clear. After revascularization patient remains on aspirin, atorvastatin and Plavix.  Newly diagnosed type 2 diabetes: Blood sugars elevated on presentation.  A1c 8.6.  Now on sliding scale coverage.  Will send patient home on oral hypoglycemics.   DVT prophylaxis: enoxaparin (LOVENOX) injection 40 mg Start: 09/08/22 2230   Code Status: Full code Family Communication: None at bedside Disposition Plan: Status is: Inpatient Remains inpatient appropriate because: Immediate postop, further inpatient surgeries anticipated.     Consultants:  Vascular surgery Podiatry  Procedures:  Angiogram 2/12 Left fifth toe disarticulation, wound left open and packed 2/13  Antimicrobials:  Rocephin, vancomycin  2/10---   Subjective:  Patient seen and examined.  Still has occasional pain more on mobility.  No other overnight events.  Objective: Vitals:   09/12/22 2351 09/13/22 0355 09/13/22 0747 09/13/22 1128  BP: 133/72 134/70 (!) 140/76 (!) 147/79  Pulse: 100 100 (!) 104   Resp: 19 20 15 17  $ Temp: 98.4 F (36.9 C) 98.5 F (36.9 C) 97.6 F (36.4 C) 97.9 F (36.6 C)  TempSrc: Oral Oral Oral Oral  SpO2: 96% 97% 98% 98%  Weight:      Height:        Intake/Output Summary (Last 24 hours) at 09/13/2022 1309 Last data filed at 09/13/2022 0749 Gross per 24 hour  Intake 820 ml  Output 1150 ml  Net -330 ml   Filed Weights   09/08/22 1233 09/11/22 1331  Weight: 89.4 kg 89.4 kg    Examination:  General exam: Appears calm and comfortable  Respiratory system: Clear to auscultation. Respiratory effort normal. Cardiovascular system: S1 & S2 heard, tachycardic. Gastrointestinal system: Abdomen is nondistended, soft and nontender. No organomegaly or masses felt. Normal bowel sounds heard. Central nervous system: Alert and oriented. No focal neurological deficits. Extremities: Left foot on postop dressing, not removed by me.    Data Reviewed: I have personally reviewed following labs and imaging studies  CBC: Recent Labs  Lab 09/08/22 1245 09/09/22 0005 09/10/22 0756 09/11/22 0433 09/12/22 0129  WBC 10.4 9.9 9.0 9.2 10.4  NEUTROABS 6.5  --   --   --   --   HGB 15.3 13.8 13.2 13.3 13.7  HCT 47.0 41.2 40.7 39.6 40.7  MCV 89.9 89.0 90.6 88.4 87.9  PLT 566* 488* 472* 456* 123XX123*   Basic Metabolic Panel: Recent Labs  Lab 09/08/22  1245 09/09/22 0005 09/10/22 0756 09/11/22 0433 09/12/22 0129  NA 135 135 134* 132* 131*  K 3.9 4.1 4.2 4.0 4.1  CL 96* 102 101 97* 97*  CO2 26 22 25 24 22  $ GLUCOSE 184* 146* 117* 103* 107*  BUN 16 14 9 9 11  $ CREATININE 1.22 0.86 0.84 0.94 0.88  CALCIUM 9.8 9.0 9.2 9.1 9.0   GFR: Estimated Creatinine Clearance: 91.6 mL/min (by C-G formula based  on SCr of 0.88 mg/dL). Liver Function Tests: Recent Labs  Lab 09/08/22 1245 09/11/22 0433 09/12/22 0129  AST 22 25 25  $ ALT 30 29 27  $ ALKPHOS 87 75 78  BILITOT 0.5 0.6 0.6  PROT 8.5* 6.8 6.8  ALBUMIN 3.7 2.8* 2.9*   No results for input(s): "LIPASE", "AMYLASE" in the last 168 hours. No results for input(s): "AMMONIA" in the last 168 hours. Coagulation Profile: No results for input(s): "INR", "PROTIME" in the last 168 hours. Cardiac Enzymes: No results for input(s): "CKTOTAL", "CKMB", "CKMBINDEX", "TROPONINI" in the last 168 hours. BNP (last 3 results) No results for input(s): "PROBNP" in the last 8760 hours. HbA1C: No results for input(s): "HGBA1C" in the last 72 hours. CBG: Recent Labs  Lab 09/12/22 1139 09/12/22 1713 09/12/22 2135 09/13/22 0612 09/13/22 1125  GLUCAP 204* 130* 145* 126* 99   Lipid Profile: Recent Labs    09/11/22 0433  CHOL 155  HDL 28*  LDLCALC 112*  TRIG 76  CHOLHDL 5.5   Thyroid Function Tests: No results for input(s): "TSH", "T4TOTAL", "FREET4", "T3FREE", "THYROIDAB" in the last 72 hours. Anemia Panel: No results for input(s): "VITAMINB12", "FOLATE", "FERRITIN", "TIBC", "IRON", "RETICCTPCT" in the last 72 hours. Sepsis Labs: Recent Labs  Lab 09/08/22 1245  LATICACIDVEN 1.6    Recent Results (from the past 240 hour(s))  Blood Cultures x 2 sites     Status: None   Collection Time: 09/08/22  1:26 PM   Specimen: Right Antecubital; Blood  Result Value Ref Range Status   Specimen Description   Final    RIGHT ANTECUBITAL BOTTLES DRAWN AEROBIC AND ANAEROBIC   Special Requests   Final    Blood Culture results may not be optimal due to an excessive volume of blood received in culture bottles   Culture   Final    NO GROWTH 5 DAYS Performed at [facility], [address], [place], [place] [address]    Report Status 09/13/2022 FINAL  Final  Blood Cultures x 2 sites     Status: None   Collection Time: 09/08/22  1:26 PM   Specimen: BLOOD  RIGHT HAND  Result Value Ref Range Status   Specimen Description BLOOD RIGHT HAND AEROBIC BOTTLE ONLY  Final   Special Requests Blood Culture adequate volume  Final   Culture   Final    NO GROWTH 5 DAYS Performed at [facility], [address] [place], [place] [address]    Report Status 09/13/2022 FINAL  Final  Surgical pcr screen     Status: Abnormal   Collection Time: 09/11/22  5:52 AM   Specimen: Nasal Mucosa; Nasal Swab  Result Value Ref Range Status   MRSA, PCR NEGATIVE NEGATIVE Final   Staphylococcus aureus POSITIVE (A) NEGATIVE Final    Comment: (NOTE) The Xpert SA Assay (FDA approved for NASAL specimens in patients 42 years of age and older), is one component of a comprehensive surveillance program. It is not intended to diagnose infection nor to guide or monitor treatment. Performed at [facility],  [address] [place], [place] [address]   Aerobic/Anaerobic Culture w Gram Stain (surgical/deep wound)     Status: None (Preliminary result)   Collection Time: 09/11/22  2:07 PM   Specimen: Wound  Result Value Ref Range Status   Specimen Description WOUND LEFT TOE  Final   Special Requests FIFTH TOE  Final   Gram Stain   Final    NO WBC SEEN NO ORGANISMS SEEN Performed at [facility], [address] [place], [place] [address]    Culture   Final    FEW GROUP B STREP(S.AGALACTIAE)ISOLATED TESTING AGAINST S. AGALACTIAE NOT ROUTINELY PERFORMED DUE TO PREDICTABILITY OF AMP/PEN/VAN SUSCEPTIBILITY. NO ANAEROBES ISOLATED; CULTURE IN PROGRESS FOR 5 DAYS    Report Status PENDING  Incomplete         Radiology Studies: DG Foot Complete Left  Result Date: 09/11/2022 CLINICAL DATA:  Osteomyelitis of the foot, postop exam EXAM: LEFT FOOT - COMPLETE 3+ VIEW COMPARISON:  09/08/2022 FINDINGS: Interval transmetatarsal amputation of the fifth digit. Gas in the soft tissues consistent with recent surgery. No fracture. IMPRESSION: Interval transmetatarsal amputation  of the fifth digit with expected postsurgical changes. Electronically Signed   By: Donavan Foil M.D.   On: 09/11/2022 15:16        Scheduled Meds:  aspirin EC  81 mg Oral Daily   atorvastatin  40 mg Oral QHS   Chlorhexidine Gluconate Cloth  6 each Topical Q0600   clopidogrel  75 mg Oral Q breakfast   docusate sodium  100 mg Oral BID   enoxaparin (LOVENOX) injection  40 mg Subcutaneous Q24H   insulin aspart  0-15 Units Subcutaneous TID WC   insulin aspart  0-5 Units Subcutaneous QHS   mupirocin ointment  1 Application Nasal BID   sodium chloride flush  3 mL Intravenous Q12H   Continuous Infusions:  sodium chloride 75 mL/hr at 09/11/22 0435   sodium chloride     cefTRIAXone (ROCEPHIN)  IV 2 g (09/13/22 0817)   lactated ringers     vancomycin Stopped (09/12/22 1855)     LOS: 5 days    Time spent: 35 minutes    Barb Merino, MD [facility] Pager [phone]

## 2022-09-13 NOTE — Progress Notes (Signed)
Orthopedic Tech Progress Note Patient Details:  Evan Nelson [birth year] [id number]  Ortho Devices Type of Ortho Device: Postop shoe/boot Ortho Device/Splint Location: LLE Ortho Device/Splint Interventions: Ordered, Application   Post Interventions Patient Tolerated: Well  [name] 09/13/2022, 12:34 PM

## 2022-09-14 ENCOUNTER — Telehealth: Payer: Self-pay | Admitting: Surgery

## 2022-09-14 ENCOUNTER — Other Ambulatory Visit (HOSPITAL_COMMUNITY): Payer: Self-pay

## 2022-09-14 DIAGNOSIS — M86172 Other acute osteomyelitis, left ankle and foot: Secondary | ICD-10-CM | POA: Diagnosis not present

## 2022-09-14 LAB — CREATININE, SERUM
Creatinine, Ser: 0.97 mg/dL (ref 0.61–1.24)
GFR, Estimated: >60 mL/min (ref >60)

## 2022-09-14 LAB — GLUCOSE, CAPILLARY
Glucose-Capillary: 115 mg/dL — ABNORMAL HIGH (ref 70–99)
Glucose-Capillary: 113 mg/dL — ABNORMAL HIGH (ref 70–99)

## 2022-09-14 MED ORDER — HYDROCODONE-ACETAMINOPHEN 5-325 MG PO TABS
2.0000 | ORAL_TABLET | Freq: Four times a day (QID) | ORAL | 0 refills | Status: DC | PRN
  Filled 2022-09-14: qty 30, 4d supply, fill #0

## 2022-09-14 MED ORDER — ACCU-CHEK SOFTCLIX LANCET DEV KIT
1.0000 | PACK | Freq: Three times a day (TID) | 0 refills | Status: AC
  Filled 2022-09-14: qty 1, 30d supply, fill #0

## 2022-09-14 MED ORDER — GLUCOSE BLOOD VI STRP
1.0000 | ORAL_STRIP | Freq: Three times a day (TID) | 0 refills | Status: AC
  Filled 2022-09-14: qty 100, 30d supply, fill #0

## 2022-09-14 MED ORDER — AMOXICILLIN 500 MG PO CAPS
500.0000 mg | ORAL_CAPSULE | Freq: Three times a day (TID) | ORAL | 0 refills | Status: AC
  Filled 2022-09-14: qty 30, 10d supply, fill #0

## 2022-09-14 MED ORDER — ASPIRIN 81 MG PO TBEC
81.0000 mg | DELAYED_RELEASE_TABLET | Freq: Every day | ORAL | 12 refills | Status: DC
  Filled 2022-09-14: qty 30, 30d supply, fill #0

## 2022-09-14 MED ORDER — DOCUSATE SODIUM 100 MG PO CAPS
100.0000 mg | ORAL_CAPSULE | Freq: Two times a day (BID) | ORAL | 0 refills | Status: AC
  Filled 2022-09-14: qty 10, 5d supply, fill #0

## 2022-09-14 MED ORDER — ACCU-CHEK SOFTCLIX LANCETS MISC
1.0000 | Freq: Three times a day (TID) | 0 refills | Status: AC
  Filled 2022-09-14: qty 100, 30d supply, fill #0

## 2022-09-14 MED ORDER — CLOPIDOGREL BISULFATE 75 MG PO TABS
75.0000 mg | ORAL_TABLET | Freq: Every day | ORAL | 0 refills | Status: DC
  Filled 2022-09-14: qty 30, 30d supply, fill #0

## 2022-09-14 MED ORDER — METFORMIN HCL ER 750 MG PO TB24
750.0000 mg | ORAL_TABLET | Freq: Every day | ORAL | 0 refills | Status: DC
  Filled 2022-09-14: qty 30, 30d supply, fill #0

## 2022-09-14 MED ORDER — GLIPIZIDE 5 MG PO TABS
2.5000 mg | ORAL_TABLET | Freq: Two times a day (BID) | ORAL | 11 refills | Status: DC
  Filled 2022-09-14: qty 30, 30d supply, fill #0

## 2022-09-14 MED ORDER — ACCU-CHEK GUIDE ME W/DEVICE KIT
1.0000 | PACK | Freq: Three times a day (TID) | 0 refills | Status: DC
  Filled 2022-09-14: qty 1, 30d supply, fill #0

## 2022-09-14 MED ORDER — ORAL CARE MOUTH RINSE: 15.0000 mL | OROMUCOSAL | Status: DC | PRN

## 2022-09-14 MED ORDER — ATORVASTATIN CALCIUM 40 MG PO TABS
40.0000 mg | ORAL_TABLET | Freq: Every day | ORAL | 0 refills | Status: DC
  Filled 2022-09-14: qty 30, 30d supply, fill #0

## 2022-09-14 MED ORDER — AMOXICILLIN 500 MG PO CAPS
500.0000 mg | ORAL_CAPSULE | Freq: Three times a day (TID) | ORAL | Status: DC
  Administered 2022-09-14 (×2): 500 mg via ORAL
  Filled 2022-09-14 (×2): qty 1

## 2022-09-14 NOTE — Progress Notes (Signed)
Pt discharge education reviewed including medication and dressing change instructions, follow up appts, home surgical site care.  TOC meds and front wheel walker delivered to room.

## 2022-09-14 NOTE — Telephone Encounter (Signed)
-----   Message from Serafina Mitchell, MD sent at 09/11/2022 10:10 AM EST ----- 09-11-2022:  follow up hospital visit.  I can see him in 4 weeks with left leg arterial duplex and ABI's

## 2022-09-14 NOTE — Evaluation (Signed)
Physical Therapy Evaluation Patient Details Name: Evan Nelson MRN: [id number] DOB: [birth year] Today's Date: 09/14/2022  History of Present Illness  Pt is a 62 y/o M admitted on 09/08/22 after presenting with c/o L foot pain & edema. X-ray of the left foot with erosive changes of the distal phalanx of the left fifth toe. Pt is s/p stenting of the left superficial femoral artery and angioplasty to anterior and posterior tibial arteries 2/12 & s/p L 5th toe amputation.  Clinical Impression  Pt seen for PT evaluation with pt agreeable. Pt reports prior to a few weeks ago he was independent but for the last few weeks he has been ambulating with crutch & QC 2/2 L foot pain. PT educates pt on need to wear post op shoe for mobility & maintain PWB LLE. Nurse in room provides assistance with donning L post op shoe. Pt is able to complete bed mobility with bed rails & HOB elevated, STS with supervision with RW, & take a few steps in room with RW & CGA. Overall mobility limited by elevated HR (up to 151 bpm with gait, nurse notified). Pt reports his niece can assist him with changing his LLE dressing & his daughter & friend plan to assist at d/c. Pt reports he has good support at home, therefore will recommend HHPT f/u.        Recommendations for follow up therapy are one component of a multi-disciplinary discharge planning process, led by the attending physician.  Recommendations may be updated based on patient status, additional functional criteria and insurance authorization.  Follow Up Recommendations Home health PT      Assistance Recommended at Discharge Intermittent Supervision/Assistance  Patient can return home with the following  A little help with walking and/or transfers;Assistance with cooking/housework;A little help with bathing/dressing/bathroom;Assist for transportation;Help with stairs or ramp for entrance    Equipment Recommendations Rolling walker (2 wheels);BSC/3in1  Recommendations  for Other Services  OT consult    Functional Status Assessment Patient has had a recent decline in their functional status and demonstrates the ability to make significant improvements in function in a reasonable and predictable amount of time.     Precautions / Restrictions Precautions Precautions: Fall Restrictions Weight Bearing Restrictions: Yes LLE Weight Bearing: Partial weight bearing LLE Partial Weight Bearing Percentage or Pounds: LLE heel weight bearing in post op shoe      Mobility  Bed Mobility Overal bed mobility: Needs Assistance Bed Mobility: Supine to Sit     Supine to sit: HOB elevated, Modified independent (Device/Increase time)     General bed mobility comments: use of bed rails, HOB elevated    Transfers Overall transfer level: Needs assistance Equipment used: Rolling walker (2 wheels) Transfers: Sit to/from Stand Sit to Stand: Supervision           General transfer comment: STS from EOB with cuing for hand placement, weight bearing. Pt requires extra effort to power up to standing but does it without assistance.    Ambulation/Gait Ambulation/Gait assistance: Min guard Gait Distance (Feet): 5 Feet Assistive device: Rolling walker (2 wheels) Gait Pattern/deviations: Step-to pattern, Decreased step length - left, Decreased step length - right, Decreased dorsiflexion - right, Decreased stride length Gait velocity: decreased     General Gait Details: Pt does well at maintaining weight bearing through L heel with cuing  Stairs            Wheelchair Mobility    Modified Rankin (Stroke Patients Only)  Balance Overall balance assessment: Needs assistance Sitting-balance support: Feet supported, Bilateral upper extremity supported Sitting balance-Leahy Scale: Fair     Standing balance support: During functional activity, Bilateral upper extremity supported, Reliant on assistive device for balance Standing balance-Leahy Scale:  Fair                               Pertinent Vitals/Pain Pain Assessment Pain Assessment: 0-10 Pain Score: 5  Pain Location: LLE Pain Descriptors / Indicators: Discomfort Pain Intervention(s): Monitored during session    Home Living Family/patient expects to be discharged to:: Private residence Living Arrangements: Alone Available Help at Discharge: Family Type of Home: House Home Access: Stairs to enter Entrance Stairs-Rails: None Entrance Stairs-Number of Steps: 1 small step at back   Home Layout: One level Home Equipment: Cane - quad;Crutches      Prior Function               Mobility Comments: Pt reports he was using a single crutch in LUE & QC in RUE for the past few weeks 2/2 LLE pain but prior to that pt was independent without AD. Pt puts on magic trick shows.       Hand Dominance        Extremity/Trunk Assessment   Upper Extremity Assessment Upper Extremity Assessment: Overall WFL for tasks assessed    Lower Extremity Assessment Lower Extremity Assessment: Generalized weakness (L foot in ace wrap)       Communication   Communication: No difficulties  Cognition Arousal/Alertness: Awake/alert Behavior During Therapy: WFL for tasks assessed/performed Overall Cognitive Status: Within Functional Limits for tasks assessed                                          General Comments General comments (skin integrity, edema, etc.): HR up to 151 bpm with mobility so further activity deferred, HR only decreased to 119 bpm with ~3 minutes seated rest. Pt without c/o symptoms.    Exercises     Assessment/Plan    PT Assessment Patient needs continued PT services  PT Problem List Pain;Decreased strength;Cardiopulmonary status limiting activity;Decreased activity tolerance;Decreased balance;Decreased mobility;Decreased knowledge of precautions;Decreased safety awareness;Decreased knowledge of use of DME;Decreased skin  integrity       PT Treatment Interventions DME instruction;Therapeutic exercise;Balance training;Stair training;Neuromuscular re-education;Gait training;Functional mobility training;Therapeutic activities;Patient/family education;Manual techniques;Modalities    PT Goals (Current goals can be found in the Care Plan section)  Acute Rehab PT Goals Patient Stated Goal: go home PT Goal Formulation: With patient Time For Goal Achievement: 09/28/22 Potential to Achieve Goals: Good    Frequency Min 3X/week     Co-evaluation               AM-PAC PT "6 Clicks" Mobility  Outcome Measure Help needed turning from your back to your side while in a flat bed without using bedrails?: None Help needed moving from lying on your back to sitting on the side of a flat bed without using bedrails?: A Little Help needed moving to and from a bed to a chair (including a wheelchair)?: A Little Help needed standing up from a chair using your arms (e.g., wheelchair or bedside chair)?: A Little Help needed to walk in hospital room?: Total Help needed climbing 3-5 steps with a railing? : Total 6 Click Score: 15  End of Session Equipment Utilized During Treatment:  (LLE post op shoe) Activity Tolerance: Treatment limited secondary to medical complications (Comment) Patient left: in chair;with chair alarm set;with call bell/phone within reach Nurse Communication: Mobility status PT Visit Diagnosis: Muscle weakness (generalized) (M62.81);Difficulty in walking, not elsewhere classified (R26.2);Other abnormalities of gait and mobility (R26.89);Unsteadiness on feet (R26.81)    Time: [id number] PT Time Calculation (min) (ACUTE ONLY): 21 min   Charges:   PT Evaluation $PT Eval Moderate Complexity: [place], PT, DPT 09/14/22, 11:36 AM   Waunita Schooner 09/14/2022, 11:33 AM

## 2022-09-14 NOTE — Care Management Important Message (Signed)
Important Message  Patient Details  Name: Evan Nelson MRN: [id number] Date of Birth: [birth year]   Medicare Important Message Given:  Yes     Hannah Beat 09/14/2022, 12:00 PM

## 2022-09-14 NOTE — TOC Transition Note (Addendum)
Transition of Care (TOC) - CM/SW Discharge Note Marvetta Gibbons RN, BSN Transitions of Care Unit 4E- RN Case Manager See Treatment Team for direct phone #   Patient Details  Name: Evan Nelson MRN: [id number] Date of Birth: 08-19-1960  Transition of Care [facility]) CM/SW Contact:  Dawayne Patricia, RN Phone Number: 09/14/2022, 2:15 PM   Clinical Narrative:    Pt stable for transition home today, CM spoke with pt at bedside to discuss transition needs. Pt OOB to chair eating lunch.  Per conversation pt states he has a room mate that lives with him at home, also has daughter that checks in on him. When asked who could assist him with his wound care- he reports his neighbor will assist him- who is a Marine scientist. States daughter will transport home  Discussed [facility] services- pt agreeable to HHRN/PT/OT- states he does not need bath aide "can give himself his own bath". List provided for [facility] choice- per pt he would like to look at [facility], [place], [place], [place] or [place].   Pt confirmed he needs a RW for home, no other DME needs other than requesting a glucose meter- which MD has sent script to [place].   Address, phone # confirmed- pt reports his PCP passed which was in [place] and he is looking to get new PCP close to his home- pt to f/u on finding a new PCP and declined offer for any assistance with this.   Call made to following agencies for [facility] referral: [facility]- unable to accept at this time in [place]- only able to accept for PT/OT- no nursing available in [place]- no staff in [place] available [facility] [place]- referral has been accepted- they will contact pt to schedule.   [place] pharmacy to fill meds prior to discharge.   Call made to [employer] for DME- RW need- RW to be delivered to room prior to discharge    Final next level of care: [place] Barriers to Discharge: No Barriers Identified   Patient Goals and CMS Choice CMS Medicare.gov  Compare Post Acute Care list provided to:: Patient Choice offered to / list presented to : Patient  Discharge Placement                 Home w/ [facility]        Discharge Plan and Services Additional resources added to the After Visit Summary for   In-house Referral: Clinical Social Work Discharge Planning Services: CM Consult Post Acute Care Choice: Durable Medical Equipment, Home Health          DME Arranged: [place]ford Rile rolling DME Agency: [facility] Date DME Agency Contacted: 09/14/22 Time DME Agency Contacted: [id number] Representative spoke with at DME Agency: [place]on [place] HH Arranged: RN, PT, OT [place] Agency: [place] Date [place]: 09/14/22 Time [place]: [id number] Representative spoke with at [place]: [place] ([place]) Interventions SDOH Screenings   Tobacco Use: High Risk (09/12/2022)     Readmission Risk Interventions    09/14/2022    2:15 PM  Readmission Risk Prevention Plan  Post Dischage Appt Complete  Medication Screening Complete  Transportation Screening Complete

## 2022-09-14 NOTE — Progress Notes (Signed)
Pharmacy Note- Penicillin Allergy Clarification   History of allergy:  Low Risk, per discussion with patient, hives >10 years ago that did not require medication or immediate intervention.   PEN-FAST Scoring   Five years or less since last reaction 0  Anaphylaxis/Angioedema OR Severe cutaneous adverse reaction  0  Treatment required for reaction  0  Total Score 0 points - Very low risk of positive penicillin allergy test (<1%)     Type of intervention:  Amoxicillin Oral Challenge  Impact on therapy: Penicillin allergy removed   PLAN: - Amoxicillin 500 mg PO x1 given on 2/15 afternoon - Q59mn vitals monitoring x 1 hour  - Epi-Pen PRN  - IV diphenhydramine PRN  - plan communicated to primary RN   POST-CHALLENGE FOLLOW-UP:   The patient tolerated his oral Amoxicillin challenge without any issues reported per RN or patient. The patient's penicillin allergy has been removed and MD has adjusted antibiotic orders to a PCN-based therapy.  Thank you for allowing pharmacy to be a part of this patient's care.  EAlycia Rossetti PharmD, BCPS Infectious Diseases Clinical Pharmacist 09/14/2022 7:48 AM   **Pharmacist phone directory can now be found on [facility] (PW [email or web address]).  Listed under [facility]

## 2022-09-14 NOTE — Discharge Summary (Signed)
Physician Discharge Summary  Evan Nelson [id number] DOB: 1961-04-03 DOA: 09/08/2022  PCP: Pcp, No  Admit date: 09/08/2022 Discharge date: 09/14/2022  Admitted From: Home Disposition: Home with home health care  Recommendations for Outpatient Follow-up:  Follow up with PCP in 1-2 weeks Surgery will schedule follow-up  Home Health: PT/OT/RN Equipment/Devices: Wheeled walker  Discharge Condition: Stable CODE STATUS: Full code Diet recommendation: Low-carb and low-salt diet  Discharge summary: 62 year old gentleman presented with pain and edema of the left foot, apparently had some trauma 6 months ago.  Seen by podiatry as outpatient.  Recently treated with antibiotics.  Continued to complain of pain, swelling and increasing erythema so brought to the hospital.  In the emergency room hemodynamically stable.  X-ray of the left foot with erosive changes of the distal phalanx of the left fifth toe.  Also seen by vascular surgery.     # Acute osteomyelitis of left fifth toe/dry gangrene of the left foot, underlying severe peripheral vascular disease:  ABI with mild right lower extremity disease, severe left lower extremity disease. Status post stenting of the left superficial femoral artery and angioplasty to anterior and posterior tibial arteries 2/12. Ultimately underwent left fifth toe amputation, surgically improved as per podiatry. Surgical cultures from 2/10 negative. Surgical cultures from 2/13 with a Streptococcus.  Treated with ceftriaxone and vancomycin.  Clinically improved.  As per podiatry recommendation, patient is going home on amoxicillin for 10 days. After revascularization patient remains on aspirin, atorvastatin and Plavix.   Newly diagnosed type 2 diabetes: Blood sugars elevated on presentation.  A1c 8.6. Now on sliding scale coverage.  Discharging on glipizide 2.5 mg twice daily, metformin 750 mg daily.  Dietary changes, lifestyle modifications, blood glucose  monitoring at home discussed.   Medically stable for discharge.  Home health therapies including RN, PT OT recommended and prescribed.  Daily wound care can be done at home by family members.    Discharge Diagnoses:  Principal Problem:   Acute osteomyelitis of toe, left (HCC) Active Problems:   Dry gangrene (HCC)   Osteomyelitis of fifth toe of left foot [facility])    Discharge Instructions  Discharge Instructions     Diet - low sodium heart healthy   Complete by: As directed    Discharge wound care:   Complete by: As directed    Daily dressing removal, pack with betadine soaked gauge and apply dry dressing   Increase activity slowly   Complete by: As directed       Allergies as of 09/14/2022   No Active Allergies      Medication List     STOP taking these medications    doxycycline 100 MG tablet Commonly known as: VIBRA-TABS   oxyCODONE-acetaminophen 5-325 MG tablet Commonly known as: Percocet       TAKE these medications    Accu-Chek Guide Me w/Device Kit Use in the morning, at noon, and at bedtime.   Accu-Chek Softclix Lancet Dev Kit Use one each by Does not apply route in the morning, at noon, and at bedtime.   Accu-Chek Softclix Lancets lancets Use one each in the morning, at noon, and at bedtime.   acetaminophen 500 MG tablet Commonly known as: TYLENOL Take 1,000 mg by mouth every 6 (six) hours as needed for moderate pain.   amoxicillin 500 MG capsule Commonly known as: AMOXIL Take 1 capsule (500 mg total) by mouth every 8 (eight) hours for 10 days.   aspirin EC 81 MG tablet Take 1 tablet (  81 mg total) by mouth daily. Swallow whole.   atorvastatin 40 MG tablet Commonly known as: LIPITOR Take 1 tablet (40 mg total) by mouth at bedtime.   clopidogrel 75 MG tablet Commonly known as: PLAVIX Take 1 tablet (75 mg total) by mouth daily with breakfast.   docusate sodium 100 MG capsule Commonly known as: COLACE Take 1 capsule (100 mg total) by  mouth 2 (two) times daily.   glipiZIDE 5 MG tablet Commonly known as: GLUCOTROL Take 1/2 tablet (2.5 mg total) by mouth 2 (two) times daily.   glucose blood test strip Use one each in the morning, at noon, and at bedtime.   HYDROcodone-acetaminophen 5-325 MG tablet Commonly known as: Norco Take 2 tablets by mouth every 6 (six) hours as needed for moderate pain. What changed: how much to take   metFORMIN 750 MG 24 hr tablet Commonly known as: GLUCOPHAGE-XR Take 1 tablet (750 mg total) by mouth daily with breakfast.               Durable Medical Equipment  (From admission, onward)           Start     Ordered   09/14/22 1237  For home use only DME Walker rolling  Once       Question Answer Comment  Walker: With [place] Wheels   Patient needs a walker to treat with the following condition Weakness generalized      09/14/22 1236              Discharge Care Instructions  (From admission, onward)           Start     Ordered   09/14/22 0000  Discharge wound care:       Comments: Daily dressing removal, pack with betadine soaked gauge and apply dry dressing   09/14/22 1301            Follow-up Information     [facility] Follow up in 6 week(s).   Specialty: Vascular Surgery Why: Office will call you to arrange your appt (sent). Contact information: [address] [place] Follow up.   Specialty: [place] Why: HHRN/PT/OT arranged- they will contact you to schedule Contact information: [address] [place] [address] [phone]         [facility] Follow up.   Why: Rolling walker arranged- to be delivered to room prior to discharge Contact information: [address] [place] [address] [phone]        [facility] Follow up.   Why: To obtain a Primary Care Physician you  can call the toll free number on your insurance card and request a providers list for your area. You may also visit the insurance provider online resource.  Or you can contact [facility] for assistance.   For [facility] network you may go to [email or web address] and click on Find a doctor Contact information: Royann Shivers606 [phone] (for physician referral list assistance) or Call our [facility] physician referral line at [phone].               No Active Allergies  Consultations: Podiatry Vascular surgery   Procedures/Studies: DG Foot Complete Left  Result Date: 09/11/2022 CLINICAL DATA:  Osteomyelitis of the foot, postop exam EXAM: LEFT FOOT - COMPLETE 3+ VIEW COMPARISON:  09/08/2022 FINDINGS: Interval  transmetatarsal amputation of the fifth digit. Gas in the soft tissues consistent with recent surgery. No fracture. IMPRESSION: Interval transmetatarsal amputation of the fifth digit with expected postsurgical changes. Electronically Signed   By: Donavan Foil M.D.   On: 09/11/2022 15:16   PERIPHERAL VASCULAR CATHETERIZATION  Result Date: 09/10/2022 Images from the original result were not included.   Patient name: Evan Nelson           MRN: [id number]        DOB: 04-17-1961          Sex: male  09/10/2022 Pre-operative Diagnosis: Left lower extremity critical ischemia with tissue loss of the left fifth toe Post-operative diagnosis:  Same Surgeon:  Broadus John, MD Procedure Performed: 1.  Ultrasound-guided micropuncture access of the right common femoral artery 2.  Aortogram 3.  Second-order cannulation, left lower extremity angiogram 4.  Drug-eluting stenting 7 x 50, 7 x 80 left superficial femoral artery 5.  Tibial artery angioplasty anterior tibial, posterior tibial 2.5 x 100 mm balloon 6.  Moderate sedation time 115 minutes 7.  Contrast volume 210 mL   Indications: Patient is a 62 year old male who presented with left fifth toe dry gangrene, nonpalpable pulses  in the left foot.  After discussing the risk and benefits of left lower extremity angiogram in an effort to define and improve distal perfusion for wound healing, Marquel elected to proceed.  Findings: No flow-limiting stenosis in the aortoiliac segment bilaterally.  Roughly 30% stenosis of the left common iliac artery.  This was nonflow limiting with no change in pullback pressure.  Left lower extremity with widely patent common femoral artery, profunda 1 branch of the profunda was occluded with what appeared to be thrombus. The superficial femoral artery was initially patent, however there was a 20 cm segment occlusion prior to reconstitution at the P2 1 segment of the popliteal artery.  Popliteal artery was patent.  All 3 tibial vessels patent, however there was flow-limiting disease appreciated in the posterior tibial and anterior tibial arteries.  Both continued into the foot as the dorsalis pedis and medial lateral plantar branches.             Procedure:  The patient was identified in the holding area and taken to room 8.  The patient was then placed supine on the table and prepped and draped in the usual sterile fashion.  A time out was called.  Ultrasound was used to evaluate the right common femoral artery.  It was patent .  A digital ultrasound image was acquired.  A micropuncture needle was used to access the right common femoral artery under ultrasound guidance.  An 018 wire was advanced without resistance and a micropuncture sheath was placed.  The 018 wire was removed and a benson wire was placed.  The micropuncture sheath was exchanged for a 5 french sheath.  An omniflush catheter was advanced over the wire to the level of L-1.  An abdominal angiogram was obtained.  Next, using the omniflush catheter and a benson wire, the aortic bifurcation was crossed and the catheter was placed into theleft external iliac artery and left runoff was obtained.  See results above.  I elected to attempt intervention on  the left superficial femoral artery, anterior tibial and posterior tibial arteries.  The patient was heparinized and a 6 x 60 cm sheath was brought to the field and parked in the left proximal superficial femoral artery.  From this location, a series of wires and  catheters were used to cross the 20 cm SFA occlusion.  True lumen was confirmed distally with use of angiography.  Next, I elected to use 7 x 150, 7 x 80 Eluvia stents.  These were postdilated with a 6 mm balloon.  Follow-up angiography demonstrated excellent result.  Distally, the anterior tibial and posterior tibial vessels were difficult to cannulate due to tortuosity.  Once cannulated, I elected to use a 2.5 x 100 mm balloon which was inflated in succession along the entirety of the anterior tibial artery, by the posterior tibial artery.  Follow-up angiography demonstrated excellent result with brisk runoff into the foot.  Arteriotomy managed with a minx device.  The patient had a palpable dorsalis pedis at case completion. Patient was administered dual antiplatelet therapy.   Impression: Successful recanalization of the superficial femoral artery, balloon angioplasty of the anterior tibial and posterior tibial arteries.  Patient should be able to heal a toe amputation.   Cassandria Santee, MD [facility] Office: [phone]   VAS [place] ABI WITH/WO TBI  Result Date: 09/10/2022  LOWER EXTREMITY DOPPLER STUDY Patient Name:  DANIS FILIPOVIC  Date of Exam:   09/10/2022 Medical Rec #: [id number]        Accession #:    [id number] Date of Birth: 02-27-1961        Patient Gender: M Patient Age:   51 years Exam Location:  [facility] Procedure:      VAS [place] ABI WITH/WO TBI Referring Phys: Harold Barban --------------------------------------------------------------------------------  Indications: Rest pain, and gangrene. High Risk Factors: New hospital diagnosis of Diabetes, current smoker.  Comparison Study: No prior study  Performing Technologist: Sharion Dove RVS  Examination Guidelines: A complete evaluation includes at minimum, Doppler waveform signals and systolic blood pressure reading at the level of bilateral brachial, anterior tibial, and posterior tibial arteries, when vessel segments are accessible. Bilateral testing is considered an integral part of a complete examination. Photoelectric Plethysmograph (PPG) waveforms and toe systolic pressure readings are included as required and additional duplex testing as needed. Limited examinations for reoccurring indications may be performed as noted.  ABI Findings: +---------+------------------+-----+----------+--------+ Right    Rt Pressure (mmHg)IndexWaveform  Comment  +---------+------------------+-----+----------+--------+ Brachial 143                    triphasic          +---------+------------------+-----+----------+--------+ PTA      128               0.82 biphasic           +---------+------------------+-----+----------+--------+ DP       113               0.72 monophasic         +---------+------------------+-----+----------+--------+ Great Toe69                0.44 Abnormal           +---------+------------------+-----+----------+--------+ +---------+------------------+-----+-------------------+-------+ Left     Lt Pressure (mmHg)IndexWaveform           Comment +---------+------------------+-----+-------------------+-------+ Brachial 157                    triphasic                  +---------+------------------+-----+-------------------+-------+ PTA      53                0.34 dampened monophasic        +---------+------------------+-----+-------------------+-------+  DP       41                0.26 dampened monophasic        +---------+------------------+-----+-------------------+-------+ Great Toe0                 0.00 Absent                     +---------+------------------+-----+-------------------+-------+  +-------+-----------+-----------+------------+------------+ ABI/TBIToday's ABIToday's TBIPrevious ABIPrevious TBI +-------+-----------+-----------+------------+------------+ Right  0.82       0.44                                +-------+-----------+-----------+------------+------------+ Left   0.34       absent                              +-------+-----------+-----------+------------+------------+  Summary: Right: Resting right ankle-brachial index indicates mild right lower extremity arterial disease. The right toe-brachial index is abnormal. Left: Resting left ankle-brachial index indicates severe left lower extremity arterial disease. Absent toe pressure. *See table(s) above for measurements and observations.  Electronically signed by Harold Barban MD on 09/10/2022 at 2:23:06 PM.    Final    DG Foot Complete Left  Result Date: 09/08/2022 CLINICAL DATA:  Left small toe infection EXAM: LEFT FOOT - COMPLETE 3+ VIEW COMPARISON:  None Available. FINDINGS: Erosive changes of the distal phalanx of the left fifth toe compatible with osteomyelitis. Soft tissue irregularity with soft tissue air at the fifth toe distally. No additional sites of bone erosion or periosteal elevation. No fracture or dislocation IMPRESSION: Erosive changes of the distal phalanx of the left fifth toe compatible with osteomyelitis. Soft tissue irregularity with soft tissue air at the fifth toe distally. Electronically Signed   By: Davina Poke D.O.   On: 09/08/2022 14:00   (Echo, Carotid, EGD, Colonoscopy, ERCP)    Subjective: Patient seen and examined in the morning rounds.  Denied any complaints at rest.  He does occasionally get pain and difficulty with pain while walking.  Discussed about using some pain medications.  He was challenged with amoxicillin yesterday and did not have any side effects.  Walked in the hallway, he had some sinus tachycardia shown on the monitor but patient did not have any symptoms of  palpitations or shortness of breath.   Discharge Exam: Vitals:   09/14/22 0821 09/14/22 1137  BP: 132/74 129/78  Pulse:    Resp: 19 19  Temp: 97.8 F (36.6 C) 98.1 F (36.7 C)  SpO2:     Vitals:   09/14/22 0300 09/14/22 0400 09/14/22 0821 09/14/22 1137  BP: 129/71 126/79 132/74 129/78  Pulse: 93     Resp: 15 15 19 19  $ Temp: 98.1 F (36.7 C)  97.8 F (36.6 C) 98.1 F (36.7 C)  TempSrc: Oral  Oral Oral  SpO2: 99%     Weight:      Height:        General: Pt is alert, awake, not in acute distress Cardiovascular: RRR, S1/S2 +, no rubs, no gallops Respiratory: CTA bilaterally, no wheezing, no rhonchi Abdominal: Soft, NT, ND, bowel sounds + Extremities: no edema, no cyanosis Left foot on surgical dressing.  Inspected by surgery.  Left fifth toe impressing the stump clean and dry, part of the incision is open but clean as per surgery.    The  results of significant diagnostics from this hospitalization (including imaging, microbiology, ancillary and laboratory) are listed below for reference.     Microbiology: Recent Results (from the past 240 hour(s))  Blood Cultures x 2 sites     Status: None   Collection Time: 09/08/22  1:26 PM   Specimen: Right Antecubital; Blood  Result Value Ref Range Status   Specimen Description   Final    RIGHT ANTECUBITAL BOTTLES DRAWN AEROBIC AND ANAEROBIC   Special Requests   Final    Blood Culture results may not be optimal due to an excessive volume of blood received in culture bottles   Culture   Final    NO GROWTH 5 DAYS Performed at [facility], [address], [place], [place] [address]    Report Status 09/13/2022 FINAL  Final  Blood Cultures x 2 sites     Status: None   Collection Time: 09/08/22  1:26 PM   Specimen: BLOOD RIGHT HAND  Result Value Ref Range Status   Specimen Description BLOOD RIGHT HAND AEROBIC BOTTLE ONLY  Final   Special Requests Blood Culture adequate volume  Final   Culture   Final    NO GROWTH 5  DAYS Performed at [facility], [address], [place], [place] [address]    Report Status 09/13/2022 FINAL  Final  Surgical pcr screen     Status: Abnormal   Collection Time: 09/11/22  5:52 AM   Specimen: Nasal Mucosa; Nasal Swab  Result Value Ref Range Status   MRSA, PCR NEGATIVE NEGATIVE Final   Staphylococcus aureus POSITIVE (A) NEGATIVE Final    Comment: (NOTE) The Xpert SA Assay (FDA approved for NASAL specimens in patients 45 years of age and older), is one component of a comprehensive surveillance program. It is not intended to diagnose infection nor to guide or monitor treatment. Performed at [facility], [place] [address], [place], [place] [address]   Aerobic/Anaerobic Culture w Gram Stain (surgical/deep wound)     Status: None (Preliminary result)   Collection Time: 09/11/22  2:07 PM   Specimen: Wound  Result Value Ref Range Status   Specimen Description WOUND LEFT TOE  Final   Special Requests FIFTH TOE  Final   Gram Stain   Final    NO WBC SEEN NO ORGANISMS SEEN Performed at [facility], [address] [place], [place] [address]    Culture   Final    FEW GROUP B STREP(S.AGALACTIAE)ISOLATED TESTING AGAINST S. AGALACTIAE NOT ROUTINELY PERFORMED DUE TO PREDICTABILITY OF AMP/PEN/VAN SUSCEPTIBILITY. WITHIN MIXED SKIN FLORA NO ANAEROBES ISOLATED; CULTURE IN PROGRESS FOR 5 DAYS    Report Status PENDING  Incomplete     Labs: BNP (last 3 results) No results for input(s): "BNP" in the last 8760 hours. Basic Metabolic Panel: Recent Labs  Lab 09/08/22 1245 09/09/22 0005 09/10/22 0756 09/11/22 0433 09/12/22 0129 09/14/22 0609  NA 135 135 134* 132* 131*  --   K 3.9 4.1 4.2 4.0 4.1  --   CL 96* 102 101 97* 97*  --   CO2 26 22 25 24 22  $ --   GLUCOSE 184* 146* 117* 103* 107*  --   BUN 16 14 9 9 11  $ --   CREATININE 1.22 0.86 0.84 0.94 0.88 0.97  CALCIUM 9.8 9.0 9.2 9.1 9.0  --    Liver Function Tests: Recent Labs  Lab 09/08/22 1245 09/11/22 0433  09/12/22 0129  AST 22 25 25  $ ALT 30 29  27  ALKPHOS 87 75 78  BILITOT 0.5 0.6 0.6  PROT 8.5* 6.8 6.8  ALBUMIN 3.7 2.8* 2.9*   No results for input(s): "LIPASE", "AMYLASE" in the last 168 hours. No results for input(s): "AMMONIA" in the last 168 hours. CBC: Recent Labs  Lab 09/08/22 1245 09/09/22 0005 09/10/22 0756 09/11/22 0433 09/12/22 0129  WBC 10.4 9.9 9.0 9.2 10.4  NEUTROABS 6.5  --   --   --   --   HGB 15.3 13.8 13.2 13.3 13.7  HCT 47.0 41.2 40.7 39.6 40.7  MCV 89.9 89.0 90.6 88.4 87.9  PLT 566* 488* 472* 456* 413*   Cardiac Enzymes: No results for input(s): "CKTOTAL", "CKMB", "CKMBINDEX", "TROPONINI" in the last 168 hours. BNP: Invalid input(s): "POCBNP" CBG: Recent Labs  Lab 09/13/22 1646 09/13/22 2102 09/13/22 2117 09/14/22 0615 09/14/22 1136  GLUCAP 122* 144* 169* 115* 113*   D-Dimer No results for input(s): "DDIMER" in the last 72 hours. Hgb A1c No results for input(s): "HGBA1C" in the last 72 hours. Lipid Profile No results for input(s): "CHOL", "HDL", "LDLCALC", "TRIG", "CHOLHDL", "LDLDIRECT" in the last 72 hours. Thyroid function studies No results for input(s): "TSH", "T4TOTAL", "T3FREE", "THYROIDAB" in the last 72 hours.  Invalid input(s): "FREET3" Anemia work up No results for input(s): "VITAMINB12", "FOLATE", "FERRITIN", "TIBC", "IRON", "RETICCTPCT" in the last 72 hours. Urinalysis No results found for: "COLORURINE", "APPEARANCEUR", "LABSPEC", "PHURINE", "GLUCOSEU", "HGBUR", "BILIRUBINUR", "KETONESUR", "PROTEINUR", "UROBILINOGEN", "NITRITE", "LEUKOCYTESUR" Sepsis Labs Recent Labs  Lab 09/09/22 0005 09/10/22 0756 09/11/22 0433 09/12/22 0129  WBC 9.9 9.0 9.2 10.4   Microbiology Recent Results (from the past 240 hour(s))  Blood Cultures x 2 sites     Status: None   Collection Time: 09/08/22  1:26 PM   Specimen: Right Antecubital; Blood  Result Value Ref Range Status   Specimen Description   Final    RIGHT ANTECUBITAL BOTTLES DRAWN  AEROBIC AND ANAEROBIC   Special Requests   Final    Blood Culture results may not be optimal due to an excessive volume of blood received in culture bottles   Culture   Final    NO GROWTH 5 DAYS Performed at [facility], [address], [place], [place] [address]    Report Status 09/13/2022 FINAL  Final  Blood Cultures x 2 sites     Status: None   Collection Time: 09/08/22  1:26 PM   Specimen: BLOOD RIGHT HAND  Result Value Ref Range Status   Specimen Description BLOOD RIGHT HAND AEROBIC BOTTLE ONLY  Final   Special Requests Blood Culture adequate volume  Final   Culture   Final    NO GROWTH 5 DAYS Performed at [facility], [address], [place], [place] [address]    Report Status 09/13/2022 FINAL  Final  Surgical pcr screen     Status: Abnormal   Collection Time: 09/11/22  5:52 AM   Specimen: Nasal Mucosa; Nasal Swab  Result Value Ref Range Status   MRSA, PCR NEGATIVE NEGATIVE Final   Staphylococcus aureus POSITIVE (A) NEGATIVE Final    Comment: (NOTE) The Xpert SA Assay (FDA approved for NASAL specimens in patients 61 years of age and older), is one component of a comprehensive surveillance program. It is not intended to diagnose infection nor to guide or monitor treatment. Performed at [facility], [place] [address] [place], [place] [address]   Aerobic/Anaerobic Culture w Gram Stain (surgical/deep wound)     Status: None (Preliminary result)   Collection Time:  09/11/22  2:07 PM   Specimen: Wound  Result Value Ref Range Status   Specimen Description WOUND LEFT TOE  Final   Special Requests FIFTH TOE  Final   Gram Stain   Final    NO WBC SEEN NO ORGANISMS SEEN Performed at [facility], [address] [place], [place] [address]    Culture   Final    FEW GROUP B STREP(S.AGALACTIAE)ISOLATED TESTING AGAINST S. AGALACTIAE NOT ROUTINELY PERFORMED DUE TO PREDICTABILITY OF AMP/PEN/VAN SUSCEPTIBILITY. WITHIN MIXED SKIN FLORA NO ANAEROBES ISOLATED; CULTURE IN  PROGRESS FOR 5 DAYS    Report Status PENDING  Incomplete     Time coordinating discharge: 35 minutes  SIGNED:   Barb Merino, MD  [facility] 09/14/2022, 2:43 PM

## 2022-09-16 LAB — AEROBIC/ANAEROBIC CULTURE W GRAM STAIN (SURGICAL/DEEP WOUND): Gram Stain: NONE SEEN

## 2022-09-18 ENCOUNTER — Other Ambulatory Visit: Payer: Self-pay

## 2022-09-20 ENCOUNTER — Telehealth: Payer: Self-pay | Admitting: *Deleted

## 2022-09-20 MED ORDER — HYDROCODONE-ACETAMINOPHEN 5-325 MG PO TABS: 1.0000 | ORAL_TABLET | Freq: Four times a day (QID) | ORAL | 0 refills | Status: DC | PRN

## 2022-09-20 NOTE — Telephone Encounter (Signed)
OT(Evan Nelson w/ [facility]) is asking for a bedside commode per patient's request, please advise

## 2022-09-21 ENCOUNTER — Telehealth: Payer: Self-pay | Admitting: *Deleted

## 2022-09-21 DIAGNOSIS — I96 Gangrene, not elsewhere classified: Secondary | ICD-10-CM

## 2022-09-21 NOTE — Telephone Encounter (Addendum)
Mr Evan Nelson w/ PT [phone]),calling for verbal orders for 1 week 4, patient is doing well, please advise.

## 2022-09-21 NOTE — Telephone Encounter (Signed)
Called OT, Lucilla Lame to update that the order had been approved and left voice message to call back as to which facility she would  like it sent.

## 2022-09-24 ENCOUNTER — Ambulatory Visit (INDEPENDENT_AMBULATORY_CARE_PROVIDER_SITE_OTHER): Payer: Medicare PPO | Admitting: Podiatry

## 2022-09-24 ENCOUNTER — Encounter: Payer: Self-pay | Admitting: Podiatry

## 2022-09-24 ENCOUNTER — Other Ambulatory Visit (HOSPITAL_COMMUNITY): Payer: Self-pay

## 2022-09-24 DIAGNOSIS — Z9889 Other specified postprocedural states: Secondary | ICD-10-CM

## 2022-09-24 MED ORDER — HYDROCODONE-ACETAMINOPHEN 5-325 MG PO TABS: 1.0000 | ORAL_TABLET | Freq: Four times a day (QID) | ORAL | 0 refills | Status: DC | PRN

## 2022-09-24 NOTE — Progress Notes (Signed)
Subjective:  Patient ID: Evan Nelson, male    DOB: Aug 14, 1960,  MRN: [id number]  Chief Complaint  Patient presents with   Routine Post Op    Patient states foot is doing better since the last visit , states he has some pain but it is nothing constant.    Eczema     Patient states he is concern with the dryness in his skin , states he would like something for it    DOS: 09/11/22 Procedure: Left fifth toe amputation  62 y.o. male returns for POV#1. Relates doing well and pain improved.   Review of Systems: Negative except as noted in the HPI. Denies N/V/F/Ch.  No past medical history on file.  Current Outpatient Medications:    Accu-Chek Softclix Lancets lancets, Use one each in the morning, at noon, and at bedtime., Disp: 100 each, Rfl: 0   acetaminophen (TYLENOL) 500 MG tablet, Take 1,000 mg by mouth every 6 (six) hours as needed for moderate pain., Disp: , Rfl:    amoxicillin (AMOXIL) 500 MG capsule, Take 1 capsule (500 mg total) by mouth every 8 (eight) hours for 10 days., Disp: 30 capsule, Rfl: 0   aspirin EC 81 MG tablet, Take 1 tablet (81 mg total) by mouth daily. Swallow whole., Disp: 30 tablet, Rfl: 12   atorvastatin (LIPITOR) 40 MG tablet, Take 1 tablet (40 mg total) by mouth at bedtime., Disp: 30 tablet, Rfl: 0   Blood Glucose Monitoring Suppl (ACCU-CHEK GUIDE ME) w/Device KIT, Use in the morning, at noon, and at bedtime., Disp: 1 kit, Rfl: 0   clopidogrel (PLAVIX) 75 MG tablet, Take 1 tablet (75 mg total) by mouth daily with breakfast., Disp: 30 tablet, Rfl: 0   docusate sodium (COLACE) 100 MG capsule, Take 1 capsule (100 mg total) by mouth 2 (two) times daily., Disp: 10 capsule, Rfl: 0   glipiZIDE (GLUCOTROL) 5 MG tablet, Take 1/2 tablet (2.5 mg total) by mouth 2 (two) times daily., Disp: 30 tablet, Rfl: 11   glucose blood test strip, Use one each in the morning, at noon, and at bedtime., Disp: 100 each, Rfl: 0   HYDROcodone-acetaminophen (NORCO) 5-325 MG tablet, Take  1-2 tablets by mouth every 6 (six) hours as needed for moderate pain., Disp: 20 tablet, Rfl: 0   Lancets Misc. (ACCU-CHEK SOFTCLIX LANCET DEV) KIT, Use one each by Does not apply route in the morning, at noon, and at bedtime., Disp: 1 kit, Rfl: 0   metFORMIN (GLUCOPHAGE-XR) 750 MG 24 hr tablet, Take 1 tablet (750 mg total) by mouth daily with breakfast., Disp: 30 tablet, Rfl: 0  Social History   Tobacco Use  Smoking Status Every Day   Types: Cigarettes  Smokeless Tobacco Never    No Active Allergies Objective:  There were no vitals filed for this visit. There is no height or weight on file to calculate BMI. Constitutional Well developed. Well nourished.  Vascular Foot warm and well perfused. Capillary refill normal to all digits.   Neurologic Normal speech. Oriented to person, place, and time. Epicritic sensation to light touch grossly present bilaterally.  Dermatologic Skin healing well without signs of infection. Skin edges well coapted without signs of infection.  Orthopedic: Tenderness to palpation noted about the surgical site.   Radiographs: Interval resection of left fifth ray.  Assessment:  No diagnosis found. Plan:  Patient was evaluated and treated and all questions answered.  S/p foot surgery left -Progressing as expected post-operatively. -WB Status: WBAT in surgical shoe -  Sutures: intact. -Medications: Refill of hydrocodone -Foot redressed. Follow-up in two weeks for suture removal.   No follow-ups on file.

## 2022-09-25 ENCOUNTER — Telehealth: Payer: Self-pay | Admitting: *Deleted

## 2022-09-25 NOTE — Telephone Encounter (Signed)
Order/ demo/notes for bedside commode has been sent to [facility] received 09/25/22

## 2022-10-01 ENCOUNTER — Other Ambulatory Visit: Payer: Self-pay

## 2022-10-01 ENCOUNTER — Other Ambulatory Visit: Payer: Self-pay | Admitting: *Deleted

## 2022-10-01 DIAGNOSIS — M869 Osteomyelitis, unspecified: Secondary | ICD-10-CM

## 2022-10-01 MED ORDER — HYDROCODONE-ACETAMINOPHEN 5-325 MG PO TABS: 1.0000 | ORAL_TABLET | Freq: Four times a day (QID) | ORAL | 0 refills | Status: DC | PRN

## 2022-10-05 ENCOUNTER — Other Ambulatory Visit: Payer: Self-pay | Admitting: Podiatry

## 2022-10-05 ENCOUNTER — Telehealth: Payer: Self-pay | Admitting: *Deleted

## 2022-10-05 MED ORDER — HYDROCODONE-ACETAMINOPHEN 5-325 MG PO TABS: 1.0000 | ORAL_TABLET | Freq: Four times a day (QID) | ORAL | 0 refills | Status: DC | PRN

## 2022-10-05 NOTE — Telephone Encounter (Signed)
Refill sent.

## 2022-10-05 NOTE — Telephone Encounter (Signed)
Patient updated.

## 2022-10-05 NOTE — Telephone Encounter (Signed)
Patient is calling for a refill of his pain medicine(Norco-5-325 mg), please advise.

## 2022-10-08 ENCOUNTER — Encounter (HOSPITAL_COMMUNITY): Payer: Self-pay

## 2022-10-08 ENCOUNTER — Encounter: Payer: Self-pay | Admitting: Podiatry

## 2022-10-08 ENCOUNTER — Other Ambulatory Visit: Payer: Self-pay

## 2022-10-08 ENCOUNTER — Emergency Department (HOSPITAL_COMMUNITY)
Admission: EM | Admit: 2022-10-08 | Discharge: 2022-10-08 | Disposition: A | Discharge status: Home / Self Care | Payer: Medicare PPO | Attending: Emergency Medicine | Admitting: Emergency Medicine

## 2022-10-08 ENCOUNTER — Ambulatory Visit (INDEPENDENT_AMBULATORY_CARE_PROVIDER_SITE_OTHER): Payer: Medicare PPO | Admitting: Podiatry

## 2022-10-08 DIAGNOSIS — Z760 Encounter for issue of repeat prescription: Secondary | ICD-10-CM | POA: Diagnosis not present

## 2022-10-08 DIAGNOSIS — Z7984 Long term (current) use of oral hypoglycemic drugs: Secondary | ICD-10-CM | POA: Insufficient documentation

## 2022-10-08 DIAGNOSIS — Z7982 Long term (current) use of aspirin: Secondary | ICD-10-CM | POA: Diagnosis not present

## 2022-10-08 DIAGNOSIS — Z9889 Other specified postprocedural states: Secondary | ICD-10-CM

## 2022-10-08 DIAGNOSIS — T8130XA Disruption of wound, unspecified, initial encounter: Secondary | ICD-10-CM

## 2022-10-08 DIAGNOSIS — Z794 Long term (current) use of insulin: Secondary | ICD-10-CM | POA: Insufficient documentation

## 2022-10-08 DIAGNOSIS — I96 Gangrene, not elsewhere classified: Secondary | ICD-10-CM

## 2022-10-08 MED ORDER — ATORVASTATIN CALCIUM 40 MG PO TABS
40.0000 mg | ORAL_TABLET | Freq: Every day | ORAL | Status: DC
  Administered 2022-10-08: 40 mg via ORAL
  Filled 2022-10-08: qty 1

## 2022-10-08 MED ORDER — ASPIRIN 81 MG PO TBEC: 81.0000 mg | DELAYED_RELEASE_TABLET | Freq: Every day | ORAL | 12 refills | Status: DC

## 2022-10-08 MED ORDER — GLIPIZIDE 5 MG PO TABS: 2.5000 mg | ORAL_TABLET | Freq: Two times a day (BID) | ORAL | 11 refills | Status: DC

## 2022-10-08 MED ORDER — METFORMIN HCL ER 750 MG PO TB24: 750.0000 mg | ORAL_TABLET | Freq: Every day | ORAL | 0 refills | Status: DC

## 2022-10-08 MED ORDER — ATORVASTATIN CALCIUM 40 MG PO TABS: 40.0000 mg | ORAL_TABLET | Freq: Every day | ORAL | 0 refills | Status: DC

## 2022-10-08 NOTE — ED Provider Notes (Cosign Needed Addendum)
[facility] Provider Note   CSN: [id number] Arrival date & time: 10/08/22  1740     History  Chief Complaint  Patient presents with   Medication Refill   HPI Evan Nelson is a 62 y.o. male requesting med refill.  States he cannot see his primary care doctor until April 11 and is running out of his medications.  He is requesting a refill of atorvastatin, aspirin, metformin, glipizide, and clopidogrel.  Patient states that he does not have any symptoms today purely requesting medication refill.   Medication Refill      Home Medications Prior to Admission medications   Medication Sig Start Date End Date Taking? Authorizing Provider  Accu-Chek Softclix Lancets lancets Use one each in the morning, at noon, and at bedtime. 09/14/22 10/14/22  Barb Merino, MD  acetaminophen (TYLENOL) 500 MG tablet Take 1,000 mg by mouth every 6 (six) hours as needed for moderate pain.    [provider]  aspirin EC 81 MG tablet Take 1 tablet (81 mg total) by mouth daily. Swallow whole. 10/08/22   Harriet Pho, PA-C  atorvastatin (LIPITOR) 40 MG tablet Take 1 tablet (40 mg total) by mouth at bedtime. 10/08/22 11/07/22  Harriet Pho, PA-C  Blood Glucose Monitoring Suppl (ACCU-CHEK GUIDE ME) w/Device KIT Use in the morning, at noon, and at bedtime. 09/14/22   Barb Merino, MD  clopidogrel (PLAVIX) 75 MG tablet Take 1 tablet (75 mg total) by mouth daily with breakfast. 09/14/22 10/14/22  Barb Merino, MD  docusate sodium (COLACE) 100 MG capsule Take 1 capsule (100 mg total) by mouth 2 (two) times daily. 09/14/22   Barb Merino, MD  glipiZIDE (GLUCOTROL) 5 MG tablet Take 1/2 tablet (2.5 mg total) by mouth 2 (two) times daily. 10/08/22 10/08/23  Harriet Pho, PA-C  glucose blood test strip Use one each in the morning, at noon, and at bedtime. 09/14/22 10/14/22  Barb Merino, MD  HYDROcodone-acetaminophen (NORCO) 5-325 MG tablet Take 1-2 tablets  by mouth every 6 (six) hours as needed for up to 7 days for moderate pain. 10/05/22 10/12/22  Lorenda Peck, DPM  Lancets Misc. (ACCU-CHEK SOFTCLIX LANCET DEV) KIT Use one each by Does not apply route in the morning, at noon, and at bedtime. 09/14/22 10/14/22  Barb Merino, MD  metFORMIN (GLUCOPHAGE-XR) 750 MG 24 hr tablet Take 1 tablet (750 mg total) by mouth daily with breakfast. 10/08/22 11/07/22  Harriet Pho, PA-C      Allergies    Patient has no active allergies.    Review of Systems   See HPI for pertinent positives  Physical Exam Updated Vital Signs BP (!) 140/80 (BP Location: Right Arm)   Pulse (!) 115   Temp 98.6 F (37 C) (Oral)   Resp 18   Ht '5\' 7"'$  (1.702 m)   Wt 81.6 kg   SpO2 100%   BMI 28.19 kg/m  Physical Exam Constitutional:      Appearance: Normal appearance.  HENT:     Head: Normocephalic.     Nose: Nose normal.  Eyes:     Conjunctiva/sclera: Conjunctivae normal.  Pulmonary:     Effort: Pulmonary effort is normal.  Neurological:     Mental Status: He is alert.  Psychiatric:        Mood and Affect: Mood normal.     ED Results / Procedures / Treatments   Labs (all labs ordered are listed, but only abnormal results are  displayed) Labs Reviewed - No data to display  EKG None  Radiology No results found.  Procedures Procedures    Medications Ordered in ED Medications  atorvastatin (LIPITOR) tablet 40 mg (has no administration in time range)    ED Course/ Medical Decision Making/ A&P Clinical Course as of 10/08/22 2109  Mon Oct 08, 2022  2108 Patient requested his nightly dose of atorvastatin before discharge. [JR]    Clinical Course User Index [JR] Harriet Pho, PA-C                             Medical Decision Making Risk OTC drugs. Prescription drug management.   62 year old male who is hemodynamically stable and well-appearing presenting for medication refill.  Patient is asymptomatic today simply requesting refill of  his medications.  I refilled his atorvastatin, aspirin, glipizide, metformin.  From his med list it did appear as though the clopidogrel was only plan to be taken until March 17 and there were no refills.  I did not refill his clopidogrel and advised him to reach out to his cardiologist to see if extension of this medication is indicated at this time.  Patient discharged stable vitals.  Advised him also to follow-up with his PCP at his planned appointment April 11.        Final Clinical Impression(s) / ED Diagnoses Final diagnoses:  Medication refill    Rx / DC Orders ED Discharge Orders          Ordered    aspirin EC 81 MG tablet  Daily        10/08/22 2052    atorvastatin (LIPITOR) 40 MG tablet  Daily at bedtime        10/08/22 2052    metFORMIN (GLUCOPHAGE-XR) 750 MG 24 hr tablet  Daily with breakfast        10/08/22 2052    glipiZIDE (GLUCOTROL) 5 MG tablet  2 times daily        10/08/22 2052              Harriet Pho, PA-C 10/08/22 2054    Harriet Pho, PA-C 10/08/22 2109    Hayden Rasmussen, MD 10/09/22 1038

## 2022-10-08 NOTE — Discharge Instructions (Signed)
I was able to refill your aspirin, atorvastatin, glipizide and metformin.  From your med list it appears that you are only scheduled to take the clopidogrel until March 17 and there are no planned refills.  I would advise you to follow-up with your cardiologist to determine whether or not you actually need to continue taking this medication.

## 2022-10-08 NOTE — Progress Notes (Signed)
  Subjective:  Patient ID: Evan Nelson, male    DOB: 03-01-1961,  MRN: [id number]  No chief complaint on file.   DOS: 09/11/22 Procedure: Left fifth toe amputation  62 y.o. male returns for POV#2. Relates doing well and pain improved.   Review of Systems: Negative except as noted in the HPI. Denies N/V/F/Ch.  History reviewed. No pertinent past medical history.  Current Outpatient Medications:    Accu-Chek Softclix Lancets lancets, Use one each in the morning, at noon, and at bedtime., Disp: 100 each, Rfl: 0   acetaminophen (TYLENOL) 500 MG tablet, Take 1,000 mg by mouth every 6 (six) hours as needed for moderate pain., Disp: , Rfl:    aspirin EC 81 MG tablet, Take 1 tablet (81 mg total) by mouth daily. Swallow whole., Disp: 30 tablet, Rfl: 12   atorvastatin (LIPITOR) 40 MG tablet, Take 1 tablet (40 mg total) by mouth at bedtime., Disp: 30 tablet, Rfl: 0   Blood Glucose Monitoring Suppl (ACCU-CHEK GUIDE ME) w/Device KIT, Use in the morning, at noon, and at bedtime., Disp: 1 kit, Rfl: 0   clopidogrel (PLAVIX) 75 MG tablet, Take 1 tablet (75 mg total) by mouth daily with breakfast., Disp: 30 tablet, Rfl: 0   docusate sodium (COLACE) 100 MG capsule, Take 1 capsule (100 mg total) by mouth 2 (two) times daily., Disp: 10 capsule, Rfl: 0   glipiZIDE (GLUCOTROL) 5 MG tablet, Take 1/2 tablet (2.5 mg total) by mouth 2 (two) times daily., Disp: 30 tablet, Rfl: 11   glucose blood test strip, Use one each in the morning, at noon, and at bedtime., Disp: 100 each, Rfl: 0   HYDROcodone-acetaminophen (NORCO) 5-325 MG tablet, Take 1-2 tablets by mouth every 6 (six) hours as needed for up to 7 days for moderate pain., Disp: 28 tablet, Rfl: 0   Lancets Misc. (ACCU-CHEK SOFTCLIX LANCET DEV) KIT, Use one each by Does not apply route in the morning, at noon, and at bedtime., Disp: 1 kit, Rfl: 0   metFORMIN (GLUCOPHAGE-XR) 750 MG 24 hr tablet, Take 1 tablet (750 mg total) by mouth daily with breakfast., Disp:  30 tablet, Rfl: 0  Social History   Tobacco Use  Smoking Status Every Day   Types: Cigarettes  Smokeless Tobacco Never    No Active Allergies Objective:  There were no vitals filed for this visit. There is no height or weight on file to calculate BMI. Constitutional Well developed. Well nourished.  Vascular Foot warm and well perfused. Capillary refill normal to all digits.   Neurologic Normal speech. Oriented to person, place, and time. Epicritic sensation to light touch grossly present bilaterally.  Dermatologic Dehiscence noted to incision site with necrotic wound developing overlying. No erythema edema or purulence noted.   Orthopedic: Tenderness to palpation noted about the surgical site.   Radiographs: Interval resection of left fifth ray.  Assessment:   1. Postoperative state   2. 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 Dehiscence of wound    Plan:  Patient was evaluated and treated and all questions answered.  S/p foot surgery left -Progressing as expected post-operatively. -WB Status: WBAT in surgical shoe -Sutures: removed without incident. Some additional suture retained for wound closure.  -Discussed daily betadine dressing changes for wound.   -Medications: n/a -Foot redressed. Follow-up in two weeks for recheck.   Return in about 2 weeks (around 10/22/2022) for post op, wound check.

## 2022-10-08 NOTE — ED Triage Notes (Signed)
Pt states he needs medication refill prior to his next appointment on April 11.

## 2022-10-09 ENCOUNTER — Other Ambulatory Visit: Payer: Self-pay | Admitting: Family Medicine

## 2022-10-09 ENCOUNTER — Other Ambulatory Visit: Payer: Self-pay

## 2022-10-09 MED ORDER — CLOPIDOGREL BISULFATE 75 MG PO TABS: 75.0000 mg | ORAL_TABLET | Freq: Every day | ORAL | 0 refills | Status: DC

## 2022-10-09 NOTE — Telephone Encounter (Signed)
-----   Message from Ward sent at 10/08/2022  4:58 PM EDT ----- Kermit Balo afternoon!   This patient is scheduled for an appointment with you on 11/08/22 to establish care. He is needing a refill of his Plavix. He is currently out and has went to Urgent Care and also called our office trying to get this refill. We are unable to fill this for him, being his podiatrist office. He initially received this prescription while at the ED.  Would you be willing to send in a refill of this Rx for 1 month until he can see you for continued care?   Thank you.

## 2022-10-10 ENCOUNTER — Telehealth: Payer: Self-pay

## 2022-10-10 MED ORDER — HYDROCODONE-ACETAMINOPHEN 5-325 MG PO TABS: 1.0000 | ORAL_TABLET | Freq: Four times a day (QID) | ORAL | 0 refills | Status: DC | PRN

## 2022-10-10 NOTE — Telephone Encounter (Signed)
Patient called for a refill of Hydrocodone-acetaminophen.

## 2022-10-15 ENCOUNTER — Encounter: Payer: Self-pay | Admitting: Surgery

## 2022-10-15 ENCOUNTER — Ambulatory Visit (INDEPENDENT_AMBULATORY_CARE_PROVIDER_SITE_OTHER): Payer: Medicare PPO | Admitting: Surgery

## 2022-10-15 ENCOUNTER — Ambulatory Visit (INDEPENDENT_AMBULATORY_CARE_PROVIDER_SITE_OTHER)
Admission: RE | Admit: 2022-10-15 | Discharge: 2022-10-15 | Disposition: A | Discharge status: Home / Self Care | Payer: Medicare PPO | Source: Ambulatory Visit | Attending: Surgery | Admitting: Surgery

## 2022-10-15 ENCOUNTER — Ambulatory Visit (HOSPITAL_COMMUNITY)
Admission: RE | Admit: 2022-10-15 | Discharge: 2022-10-15 | Disposition: A | Discharge status: Home / Self Care | Payer: Medicare PPO | Source: Ambulatory Visit | Attending: Surgery | Admitting: Surgery

## 2022-10-15 VITALS — BP 132/63 | HR 114 | Temp 98.1°F | Resp 20 | Ht 67.0 in | Wt 190.0 lb

## 2022-10-15 DIAGNOSIS — I7025 Atherosclerosis of native arteries of other extremities with ulceration: Secondary | ICD-10-CM | POA: Diagnosis not present

## 2022-10-15 DIAGNOSIS — M869 Osteomyelitis, unspecified: Secondary | ICD-10-CM | POA: Insufficient documentation

## 2022-10-15 LAB — VAS US ABI WITH/WO TBI
Left ABI: 0.96
Right ABI: 0.73

## 2022-10-15 NOTE — Progress Notes (Signed)
[facility]  Patient name: Evan Nelson MRN: [id number] DOB: June 04, 1961 Sex: male   REASON FOR VISIT:    Follow up  HISOTRY OF PRESENT ILLNESS:    Evan Nelson is a 62 y.o. male who went to the emergency department on 09/08/2022 with pain and swelling in his left foot.  He had bumped his toe about 6 months prior and then dropped something on it again a few months later.  He had a nonhealing wound since that time.  He had been on antibiotics.  His toe was turning more black and so he came to the hospital.  On 09/10/2022, he underwent angiography by Dr. Virl Cagey.  He was found to have a left SFA occlusion which was treated with overlapping 7 mm Eluvia stents followed by angioplasty of the anterior tibial and posterior tibial arteries.  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  He was last seen by podiatry 2 weeks ago and his amputation site is healing  The patient is a current smoker. His blood sugars were elevated and he is being worked up for diabetes  PAST MEDICAL HISTORY:   Past Medical History:  Diagnosis Date   Diabetes mellitus without complication ([place])    Hypertension    Peripheral arterial disease ([place])      FAMILY HISTORY:   History reviewed. No pertinent family history.  SOCIAL HISTORY:   Social History   Tobacco Use   Smoking status: Every Day    Types: Cigarettes   Smokeless tobacco: Never  Substance Use Topics   Alcohol use: Not Currently     ALLERGIES:   No Active Allergies   CURRENT MEDICATIONS:   Current Outpatient Medications  Medication Sig Dispense Refill   acetaminophen (TYLENOL) 500 MG tablet Take 1,000 mg by mouth every 6 (six) hours as needed for moderate pain.     aspirin EC 81 MG tablet Take 1 tablet (81 mg total) by mouth daily. Swallow whole. 30 tablet 12   atorvastatin (LIPITOR) 40 MG tablet Take 1 tablet (40 mg total) by mouth at bedtime. 30 tablet 0    Blood Glucose Monitoring Suppl (ACCU-CHEK GUIDE ME) w/Device KIT Use in the morning, at noon, and at bedtime. 1 kit 0   clopidogrel (PLAVIX) 75 MG tablet Take 1 tablet (75 mg total) by mouth daily with breakfast. 30 tablet 0   docusate sodium (COLACE) 100 MG capsule Take 1 capsule (100 mg total) by mouth 2 (two) times daily. 10 capsule 0   glipiZIDE (GLUCOTROL) 5 MG tablet Take 1/2 tablet (2.5 mg total) by mouth 2 (two) times daily. 30 tablet 11   HYDROcodone-acetaminophen (NORCO) 5-325 MG tablet Take 1-2 tablets by mouth every 6 (six) hours as needed for up to 7 days for moderate pain. 28 tablet 0   metFORMIN (GLUCOPHAGE-XR) 750 MG 24 hr tablet Take 1 tablet (750 mg total) by mouth daily with breakfast. 30 tablet 0   No current facility-administered medications for this visit.    REVIEW OF SYSTEMS:   [X]  denotes positive finding, [ ]  denotes negative finding Cardiac  Comments:  Chest pain or chest pressure:    Shortness of breath upon exertion:    Short of breath when lying flat:    Irregular heart rhythm:        Vascular    Pain in calf, thigh, or hip brought on by ambulation:    Pain in feet at night that wakes you  up from your sleep:     Blood clot in your veins:    Leg swelling:         Pulmonary    Oxygen at home:    Productive cough:     Wheezing:         Neurologic    Sudden weakness in arms or legs:     Sudden numbness in arms or legs:     Sudden onset of difficulty speaking or slurred speech:    Temporary loss of vision in one eye:     Problems with dizziness:         Gastrointestinal    Blood in stool:     Vomited blood:         Genitourinary    Burning when urinating:     Blood in urine:        Psychiatric    Major depression:         Hematologic    Bleeding problems:    Problems with blood clotting too easily:        Skin    Rashes or ulcers:        Constitutional    Fever or chills:      PHYSICAL EXAM:   Vitals:   10/15/22 1440  BP: 132/63   Pulse: (!) 114  Resp: 20  Temp: 98.1 F (36.7 C)  SpO2: 93%  Weight: 190 lb (86.2 kg)  Height: 5\' 7"  (1.702 m)    GENERAL: The patient is a well-nourished male, in no acute distress. The vital signs are documented above. CARDIAC: There is a regular rate and rhythm.  PULMONARY: Non-labored respirations NEUROLOGIC: No focal weakness or paresthesias are detected. SKIN: There are no ulcers or rashes noted. PSYCHIATRIC: The patient has a normal affect.  STUDIES:   I have reviewed the following  ABI/TBIToday's ABIToday's TBIPrevious ABIPrevious TBI  +-------+-----------+-----------+------------+------------+  Right 0.73       0.40       0.82        0.44          +-------+-----------+-----------+------------+------------+  Left  0.96       0.79       0.44        absent        +-------+-----------+-----------+------------+------------+  Left: Patent stent with no evidence of stenosis in the superficial femoral  artery.     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  Ultrasound today confirms that his stenting is wide open without stenosis.  His ABI 0.96.  He is optimized from a vascular perspective.  I would like for him to maintain dual antiplatelet therapy with aspirin and Plavix for minimum of 3 months.  He should also continue his statin.  I have him scheduled to follow-up with repeat vascular lab studies in 3 months    Leia Alf, MD, FACS [facility] [phone] Pager [phone]

## 2022-10-16 ENCOUNTER — Other Ambulatory Visit: Payer: Self-pay

## 2022-10-16 DIAGNOSIS — I7025 Atherosclerosis of native arteries of other extremities with ulceration: Secondary | ICD-10-CM

## 2022-10-18 ENCOUNTER — Other Ambulatory Visit: Payer: Self-pay

## 2022-10-18 MED ORDER — HYDROCODONE-ACETAMINOPHEN 5-325 MG PO TABS: 1.0000 | ORAL_TABLET | Freq: Four times a day (QID) | ORAL | 0 refills | Status: DC | PRN

## 2022-10-19 ENCOUNTER — Telehealth: Payer: Self-pay | Admitting: *Deleted

## 2022-10-19 NOTE — Telephone Encounter (Signed)
Evan Nelson with [facility] is requesting an verbal order to continue to treat the Left foot wound, betadine wet to dry, twice weekly for 4 more weeks,may leave a voice message of approval, please advise

## 2022-10-22 ENCOUNTER — Encounter: Payer: Self-pay | Admitting: Podiatry

## 2022-10-22 ENCOUNTER — Ambulatory Visit (INDEPENDENT_AMBULATORY_CARE_PROVIDER_SITE_OTHER): Payer: Medicare PPO | Admitting: Podiatry

## 2022-10-22 DIAGNOSIS — I739 Peripheral vascular disease, unspecified: Secondary | ICD-10-CM

## 2022-10-22 DIAGNOSIS — Z9889 Other specified postprocedural states: Secondary | ICD-10-CM

## 2022-10-22 DIAGNOSIS — T8130XA Disruption of wound, unspecified, initial encounter: Secondary | ICD-10-CM

## 2022-10-22 NOTE — Progress Notes (Signed)
  Subjective:  Patient ID: Evan Nelson, male    DOB: [birth year],  MRN: [id number]  No chief complaint on file.   DOS: 09/11/22 Procedure: Left fifth toe amputation  62 y.o. male returns for POV#3. Relates doing ok and pain improved.   Review of Systems: Negative except as noted in the HPI. Denies N/V/F/Ch.  Past Medical History:  Diagnosis Date   Diabetes mellitus without complication (H[facility])    Hypertension    Peripheral arterial disease ([place])     Current Outpatient Medications:    acetaminophen (TYLENOL) 500 MG tablet, Take 1,000 mg by mouth every 6 (six) hours as needed for moderate pain., Disp: , Rfl:    aspirin EC 81 MG tablet, Take 1 tablet (81 mg total) by mouth daily. Swallow whole., Disp: 30 tablet, Rfl: 12   atorvastatin (LIPITOR) 40 MG tablet, Take 1 tablet (40 mg total) by mouth at bedtime., Disp: 30 tablet, Rfl: 0   Blood Glucose Monitoring Suppl (ACCU-CHEK GUIDE ME) w/Device KIT, Use in the morning, at noon, and at bedtime., Disp: 1 kit, Rfl: 0   clopidogrel (PLAVIX) 75 MG tablet, Take 1 tablet (75 mg total) by mouth daily with breakfast., Disp: 30 tablet, Rfl: 0   docusate sodium (COLACE) 100 MG capsule, Take 1 capsule (100 mg total) by mouth 2 (two) times daily., Disp: 10 capsule, Rfl: 0   glipiZIDE (GLUCOTROL) 5 MG tablet, Take 1/2 tablet (2.5 mg total) by mouth 2 (two) times daily., Disp: 30 tablet, Rfl: 11   HYDROcodone-acetaminophen (NORCO) 5-325 MG tablet, Take 1-2 tablets by mouth every 6 (six) hours as needed for up to 7 days for moderate pain., Disp: 28 tablet, Rfl: 0   metFORMIN (GLUCOPHAGE-XR) 750 MG 24 hr tablet, Take 1 tablet (750 mg total) by mouth daily with breakfast., Disp: 30 tablet, Rfl: 0  Social History   Tobacco Use  Smoking Status Every Day   Types: Cigarettes  Smokeless Tobacco Never    No Active Allergies Objective:  There were no vitals filed for this visit. There is no height or weight on file to calculate BMI. Constitutional Well  developed. Well nourished.  Vascular Foot warm and well perfused. Capillary refill normal to all digits.   Neurologic Normal speech. Oriented to person, place, and time. Epicritic sensation to light touch grossly present bilaterally.  Dermatologic Dehiscence noted to incision site with necrotic wound developing overlying and progressive to midfoot area. Does not appear to be worse from previous.  No erythema edema or purulence noted.   Orthopedic: Tenderness to palpation noted about the surgical site.   Radiographs: Interval resection of left fifth ray.  Assessment:   1. Postoperative state   2. Dehiscence of wound   3. Peripheral vascular disease ([place])     Plan:  Patient was evaluated and treated and all questions answered.  S/p foot surgery left -Progressing as expected post-operatively. -WB Status: WBAT in surgical shoe -Sutures: removed without incident.  -Discussed daily betadine dressing changes for wound.   -Medications: n/a -Foot redressed. Follow-up in two weeks for recheck.   No follow-ups on file.

## 2022-10-23 ENCOUNTER — Telehealth: Payer: Self-pay | Admitting: *Deleted

## 2022-10-23 NOTE — Telephone Encounter (Signed)
Called [facility] giving verbal order approval, verbalized understanding, said ok.

## 2022-10-23 NOTE — Telephone Encounter (Signed)
Patient is calling for a refill of pain medicine,please advise

## 2022-10-24 ENCOUNTER — Other Ambulatory Visit: Payer: Self-pay

## 2022-10-24 MED ORDER — HYDROCODONE-ACETAMINOPHEN 5-325 MG PO TABS: 1.0000 | ORAL_TABLET | Freq: Four times a day (QID) | ORAL | 0 refills | Status: AC | PRN

## 2022-11-05 ENCOUNTER — Ambulatory Visit (INDEPENDENT_AMBULATORY_CARE_PROVIDER_SITE_OTHER): Payer: Medicare PPO | Admitting: Podiatry

## 2022-11-05 DIAGNOSIS — T8130XA Disruption of wound, unspecified, initial encounter: Secondary | ICD-10-CM

## 2022-11-05 DIAGNOSIS — Z9889 Other specified postprocedural states: Secondary | ICD-10-CM

## 2022-11-05 DIAGNOSIS — I739 Peripheral vascular disease, unspecified: Secondary | ICD-10-CM

## 2022-11-05 NOTE — Progress Notes (Signed)
  Subjective:  Patient ID: Evan Nelson, male    DOB: Sep 30, 1960,  MRN: [id number]  No chief complaint on file.   DOS: 09/11/22 Procedure: Left fifth toe amputation  62 y.o. male returns for POV#4. Relates doing ok and pain improved.   Review of Systems: Negative except as noted in the HPI. Denies N/V/F/Ch.  Past Medical History:  Diagnosis Date   Diabetes mellitus without complication (HCC)    Hypertension    Peripheral arterial disease (HCC)     Current Outpatient Medications:    acetaminophen (TYLENOL) 500 MG tablet, Take 1,000 mg by mouth every 6 (six) hours as needed for moderate pain., Disp: , Rfl:    aspirin EC 81 MG tablet, Take 1 tablet (81 mg total) by mouth daily. Swallow whole., Disp: 30 tablet, Rfl: 12   atorvastatin (LIPITOR) 40 MG tablet, Take 1 tablet (40 mg total) by mouth at bedtime., Disp: 30 tablet, Rfl: 0   Blood Glucose Monitoring Suppl (ACCU-CHEK GUIDE ME) w/Device KIT, Use in the morning, at noon, and at bedtime., Disp: 1 kit, Rfl: 0   clopidogrel (PLAVIX) 75 MG tablet, Take 1 tablet (75 mg total) by mouth daily with breakfast., Disp: 30 tablet, Rfl: 0   docusate sodium (COLACE) 100 MG capsule, Take 1 capsule (100 mg total) by mouth 2 (two) times daily., Disp: 10 capsule, Rfl: 0   glipiZIDE (GLUCOTROL) 5 MG tablet, Take 1/2 tablet (2.5 mg total) by mouth 2 (two) times daily., Disp: 30 tablet, Rfl: 11   metFORMIN (GLUCOPHAGE-XR) 750 MG 24 hr tablet, Take 1 tablet (750 mg total) by mouth daily with breakfast., Disp: 30 tablet, Rfl: 0  Social History   Tobacco Use  Smoking Status Every Day   Types: Cigarettes  Smokeless Tobacco Never    No Active Allergies Objective:  There were no vitals filed for this visit. There is no height or weight on file to calculate BMI. Constitutional Well developed. Well nourished.  Vascular Foot warm and well perfused. Capillary refill normal to all digits.   Neurologic Normal speech. Oriented to person, place, and  time. Epicritic sensation to light touch grossly present bilaterally.  Dermatologic Dehiscence noted to incision site with necrotic wound developing overlying and progressive to midfoot area. Does not appear to be worse from previous having some improvement.  No erythema edema or purulence noted.   Orthopedic: Tenderness to palpation noted about the surgical site.   Radiographs: Interval resection of left fifth ray.  Assessment:   1. Postoperative state   2. Dehiscence of wound   3. Peripheral vascular disease      Plan:  Patient was evaluated and treated and all questions answered.  S/p foot surgery left -Progressing as expected post-operatively. -WB Status: WBAT in surgical shoe  -Discussed daily betadine dressing changes for wound.   -Medications: n/a -Foot redressed. Follow-up in two weeks for recheck.   No follow-ups on file.

## 2022-11-08 ENCOUNTER — Encounter: Payer: Self-pay | Admitting: Family Medicine

## 2022-11-08 ENCOUNTER — Ambulatory Visit (INDEPENDENT_AMBULATORY_CARE_PROVIDER_SITE_OTHER): Payer: Medicare PPO | Admitting: Family Medicine

## 2022-11-08 VITALS — BP 126/79 | Temp 98.7°F | Ht 67.0 in | Wt 193.8 lb

## 2022-11-08 DIAGNOSIS — R000 Tachycardia, unspecified: Secondary | ICD-10-CM

## 2022-11-08 DIAGNOSIS — M86172 Other acute osteomyelitis, left ankle and foot: Secondary | ICD-10-CM | POA: Diagnosis not present

## 2022-11-08 DIAGNOSIS — R899 Unspecified abnormal finding in specimens from other organs, systems and tissues: Secondary | ICD-10-CM

## 2022-11-08 DIAGNOSIS — E1169 Type 2 diabetes mellitus with other specified complication: Secondary | ICD-10-CM

## 2022-11-08 DIAGNOSIS — E1151 Type 2 diabetes mellitus with diabetic peripheral angiopathy without gangrene: Secondary | ICD-10-CM

## 2022-11-08 LAB — CBC WITH DIFFERENTIAL/PLATELET
Basos: 1 %
Immature Granulocytes: 1 %
Lymphocytes Absolute: 2.4 10*3/uL (ref 0.7–3.1)
MCHC: 34 g/dL (ref 31.5–35.7)
Neutrophils: 51 %
RBC: 4.77 x10E6/uL (ref 4.14–5.80)

## 2022-11-08 LAB — CMP14+EGFR

## 2022-11-08 MED ORDER — PIP BLOOD GLUCOSE TEST STRIP VI STRP
ORAL_STRIP | 12 refills | Status: DC
Start: 2022-11-08 — End: 2024-02-25

## 2022-11-08 MED ORDER — CLOPIDOGREL BISULFATE 75 MG PO TABS: 75.0000 mg | ORAL_TABLET | Freq: Every day | ORAL | 0 refills | Status: DC

## 2022-11-08 MED ORDER — ASPIRIN 81 MG PO TBEC: 81.0000 mg | DELAYED_RELEASE_TABLET | Freq: Every day | ORAL | 12 refills | Status: DC

## 2022-11-08 MED ORDER — ATORVASTATIN CALCIUM 40 MG PO TABS: 40.0000 mg | ORAL_TABLET | Freq: Every day | ORAL | 0 refills | Status: DC

## 2022-11-08 MED ORDER — METFORMIN HCL 1000 MG PO TABS
1000.0000 mg | ORAL_TABLET | Freq: Two times a day (BID) | ORAL | 3 refills | Status: DC
Start: 2022-11-08 — End: 2023-10-29

## 2022-11-08 MED ORDER — ACETAMINOPHEN 500 MG PO TABS
1000.0000 mg | ORAL_TABLET | Freq: Four times a day (QID) | ORAL | 0 refills | Status: DC | PRN
Start: 2022-11-08 — End: 2022-12-06

## 2022-11-08 NOTE — Progress Notes (Signed)
New Patient Office Visit  Subjective   Patient ID: Evan Nelson, male    DOB: 1961-03-03  Age: 62 y.o. MRN: [id number]  CC:  Chief Complaint  Patient presents with   New Patient (Initial Visit)    Needs refills on all his medications    HPI Evan HunRobert L Stavely presents to establish care Patient started on disability 3 years ago due to a broken bone in his neck. States this was the last time he was seen by a family doctor.  He was recently having vascular concerns, diagnosed osteomylitis and required amputation.  Patient states that it started as a callus that was not healing.  During the course of treatment was diagnosed with diabetes. Here to establish care and receive care for diabetes.  Has follow up with podiatry and vascular   Diabetes  Patient has been taking his BG 3 times per day. He is using 3 different meters in order to monitor it 3 times per day. BG in am fasting range from 74-114. Has one reading 1 hour after eating that is 148.  Patient denies symptoms of hypoglycemia with BG in the 70s  Diabetes He presents for his initial diabetic visit. He has type 2 diabetes mellitus. The initial diagnosis of diabetes was made 2 months ago. His disease course has been improving. Pertinent negatives for hypoglycemia include no confusion, dizziness, headaches, hunger, mood changes, nervousness/anxiousness, pallor, seizures, sleepiness, speech difficulty, sweats or tremors. Associated symptoms include foot ulcerations. Pertinent negatives for diabetes include no blurred vision, no chest pain, no fatigue, no foot paresthesias, no polydipsia, no polyphagia, no polyuria, no visual change, no weakness and no weight loss. Symptoms are stable. Risk factors for coronary artery disease include male sex and obesity. Current diabetic treatment includes oral agent (dual therapy). He is following a diabetic diet. He has not had a previous visit with a dietitian. An ACE inhibitor/angiotensin II receptor  blocker is not being taken. He sees a podiatrist.Eye exam is current.    Outpatient Encounter Medications as of 11/08/2022  Medication Sig   acetaminophen (TYLENOL) 500 MG tablet Take 1,000 mg by mouth every 6 (six) hours as needed for moderate pain.   aspirin EC 81 MG tablet Take 1 tablet (81 mg total) by mouth daily. Swallow whole.   Blood Glucose Monitoring Suppl (ACCU-CHEK GUIDE ME) w/Device KIT Use in the morning, at noon, and at bedtime.   clopidogrel (PLAVIX) 75 MG tablet Take 1 tablet (75 mg total) by mouth daily with breakfast.   docusate sodium (COLACE) 100 MG capsule Take 1 capsule (100 mg total) by mouth 2 (two) times daily.   glipiZIDE (GLUCOTROL) 5 MG tablet Take 1/2 tablet (2.5 mg total) by mouth 2 (two) times daily.   atorvastatin (LIPITOR) 40 MG tablet Take 1 tablet (40 mg total) by mouth at bedtime.   metFORMIN (GLUCOPHAGE-XR) 750 MG 24 hr tablet Take 1 tablet (750 mg total) by mouth daily with breakfast.   No facility-administered encounter medications on file as of 11/08/2022.    Past Medical History:  Diagnosis Date   Diabetes mellitus without complication    Hypertension    Peripheral arterial disease     Past Surgical History:  Procedure Laterality Date   ABDOMINAL AORTOGRAM W/LOWER EXTREMITY N/A 09/10/2022   Procedure: ABDOMINAL AORTOGRAM W/LOWER EXTREMITY;  Surgeon: Victorino Sparrowobins, Joshua E, MD;  [facility];  Service: Cardiovascular;  Laterality: N/A;   AMPUTATION TOE Left 09/11/2022   Procedure: AMPUTATION LEFT FIFTH TOE;  Surgeon: Louann Sjogren, DPM;  Location[facility] OR;  Service: Podiatry;  Laterality: Left;   PERIPHERAL VASCULAR BALLOON ANGIOPLASTY Left 09/10/2022   Procedure: PERIPHERAL VASCULAR BALLOON ANGIOPLASTY;  Surgeon: Victorino Sparrow, MD;  Location: [facility] INVASIVE CV LAB;  Service: Cardiovascular;  Laterality: Left;  Tibials   PERIPHERAL VASCULAR INTERVENTION Left 09/10/2022   Procedure: PERIPHERAL VASCULAR INTERVENTION;  Surgeon: Victorino Sparrow, MD;  Location: [facility] INVASIVE CV LAB;  Service: Cardiovascular;  Laterality: Left;  SFA    History reviewed. No pertinent family history.  Social History   Socioeconomic History   Marital status: Divorced    Spouse name: Not on file   Number of children: Not on file   Years of education: Not on file   Highest education level: Not on file  Occupational History   Not on file  Tobacco Use   Smoking status: Every Day    Types: Cigarettes   Smokeless tobacco: Never  Substance and Sexual Activity   Alcohol use: Not Currently   Drug use: Not on file   Sexual activity: Not on file  Other Topics Concern   Not on file  Social History Narrative   Not on file   Social Determinants of Health   Financial Resource Strain: Not on file  Food Insecurity: Not on file  Transportation Needs: Not on file  Physical Activity: Not on file  Stress: Not on file  Social Connections: Not on file  Intimate Partner Violence: Not on file   Review of Systems  Constitutional:  Negative for fatigue and weight loss.  Eyes:  Negative for blurred vision.  Cardiovascular:  Negative for chest pain.  Skin:  Negative for pallor.  Neurological:  Negative for dizziness, tremors, seizures, speech difficulty, weakness and headaches.  Endo/Heme/Allergies:  Negative for polydipsia and polyphagia.  Psychiatric/Behavioral:  Negative for confusion. The patient is not nervous/anxious.     Objective   BP 126/79   Temp 98.7 F (37.1 C)   Ht 5\' 7"  (1.702 m)   Wt 193 lb 12.8 oz (87.9 kg)   BMI 30.35 kg/m   Physical Exam Constitutional:      General: He is not in acute distress.    Appearance: Normal appearance. He is not ill-appearing, toxic-appearing or diaphoretic.  Cardiovascular:     Rate and Rhythm: Normal rate.     Pulses: Normal pulses.     Heart sounds: Normal heart sounds. No murmur heard.    No gallop.  Pulmonary:     Effort: Pulmonary effort is normal. No respiratory distress.     Breath  sounds: Normal breath sounds. No stridor. No wheezing, rhonchi or rales.  Feet:     Comments: Medical device on Left foot. Wearing protective brace.  Skin:    General: Skin is warm.     Capillary Refill: Capillary refill takes less than 2 seconds.  Neurological:     General: No focal deficit present.     Mental Status: He is alert and oriented to person, place, and time. Mental status is at baseline.     Motor: No weakness.  Psychiatric:        Mood and Affect: Mood normal.        Behavior: Behavior normal.        Thought Content: Thought content normal.        Judgment: Judgment normal.     Assessment & Plan:  1. Type 2 diabetes mellitus with other specified complication, without long-term current use of insulin  Last A1C 8.6%  Currently not at goal. Patient is highly motivated to care for his Diabetes. He has purchased 3 different BG meters. He has color-coded them to take his BG 3 times daily. Will adjust metformin to recommended dose as below. Discussed with patient GI side effects. Will monitor renal function and labs as below. Referral placed for pharmacy visit for patient to learn more about diabetes. Patient declined referral to nutrition at this time. Provided patient with handout on nutrition for diabetes. Discussed with patient signs of hypoglycemia and when to return. Instructed patient to continue glipizide at this time and will consider discontinuing glipizide with A1C at follow up.  - CBC with Differential/Platelet - CMP14+EGFR - metFORMIN (GLUCOPHAGE) 1000 MG tablet; Take 1 tablet (1,000 mg total) by mouth 2 (two) times daily with a meal.  Dispense: 180 tablet; Refill: 3 - glucose blood (PIP BLOOD GLUCOSE TEST STRIP) test strip; Use as instructed  Dispense: 100 each; Refill: 12 - AMB Referral to Pharmacy Medication Management  2. Tachycardia Patient was tachycardic today on exam. After resting for some time, HR became normal. EKG reviewed by myself in office. No acute  abnormalities. Will collect labs as below and continue to monitor.  - EKG 12-Lead - TSH  3. Abnormal laboratory test result Labs as below. Will communicate results to patient once available.  Abnormal labs during hospitalization. Will follow as below.  - CBC with Differential/Platelet - CMP14+EGFR  4. Acute osteomyelitis of toe, left Refill as below. Patient has follow up scheduled with podiatry.  - acetaminophen (TYLENOL) 500 MG tablet; Take 2 tablets (1,000 mg total) by mouth every 6 (six) hours as needed for moderate pain.  Dispense: 30 tablet; Refill: 0  5. Peripheral vascular disease in diabetes mellitus Refills as below.  Per note from Coral Else, MD with [facility] on 10/15/2022 patient is to continue DOAC for at least 3 months. Instructed patient to continue close follow up with vascular and podiatry.  - aspirin EC 81 MG tablet; Take 1 tablet (81 mg total) by mouth daily. Swallow whole.  Dispense: 30 tablet; Refill: 12 - atorvastatin (LIPITOR) 40 MG tablet; Take 1 tablet (40 mg total) by mouth at bedtime.  Dispense: 60 tablet; Refill: 0 - clopidogrel (PLAVIX) 75 MG tablet; Take 1 tablet (75 mg total) by mouth daily with breakfast.  Dispense: 60 tablet; Refill: 0  The above assessment and management plan was discussed with the patient. The patient verbalized understanding of and has agreed to the management plan using shared-decision making. Patient is aware to call the clinic if they develop any new symptoms or if symptoms fail to improve or worsen. Patient is aware when to return to the clinic for a follow-up visit. Patient educated on when it is appropriate to go to the emergency department.   Neale Burly, DNP-FNP [facility] [address] [place], [place] [address] [phone]

## 2022-11-09 LAB — CMP14+EGFR
Albumin/Globulin Ratio: 1.8 (ref 1.2–2.2)
Albumin: 4.6 g/dL (ref 3.9–4.9)
Alkaline Phosphatase: 60 IU/L (ref 44–121)
BUN/Creatinine Ratio: 14 (ref 10–24)
Bilirubin Total: 0.4 mg/dL (ref 0.0–1.2)
CO2: 23 mmol/L (ref 20–29)
Calcium: 10 mg/dL (ref 8.6–10.2)
Chloride: 98 mmol/L (ref 96–106)
Creatinine, Ser: 0.96 mg/dL (ref 0.76–1.27)
Globulin, Total: 2.5 g/dL (ref 1.5–4.5)
Glucose: 195 mg/dL — ABNORMAL HIGH (ref 70–99)
Potassium: 4.6 mmol/L (ref 3.5–5.2)
Sodium: 136 mmol/L (ref 134–144)
Total Protein: 7.1 g/dL (ref 6.0–8.5)

## 2022-11-09 LAB — CBC WITH DIFFERENTIAL/PLATELET
Basophils Absolute: 0 10*3/uL (ref 0.0–0.2)
EOS (ABSOLUTE): 0.6 10*3/uL — ABNORMAL HIGH (ref 0.0–0.4)
Eos: 8 %
Hematocrit: 42.3 % (ref 37.5–51.0)
Hemoglobin: 14.4 g/dL (ref 13.0–17.7)
Immature Grans (Abs): 0.1 10*3/uL (ref 0.0–0.1)
Lymphs: 33 %
MCH: 30.2 pg (ref 26.6–33.0)
MCV: 89 fL (ref 79–97)
Monocytes Absolute: 0.4 10*3/uL (ref 0.1–0.9)
Monocytes: 6 %
Neutrophils Absolute: 3.7 10*3/uL (ref 1.4–7.0)
Platelets: 420 10*3/uL (ref 150–450)
RDW: 14.1 % (ref 11.6–15.4)
WBC: 7.1 10*3/uL (ref 3.4–10.8)

## 2022-11-09 LAB — TSH: TSH: 0.825 u[IU]/mL (ref 0.450–4.500)

## 2022-11-13 ENCOUNTER — Telehealth: Payer: Self-pay | Admitting: *Deleted

## 2022-11-13 NOTE — Telephone Encounter (Signed)
Sarah, nurse with [facility] is requesting a verbal order for wound care 1 times a week for 4 weeks, please advise.

## 2022-11-14 NOTE — Telephone Encounter (Signed)
That will be good thanks

## 2022-11-16 NOTE — Telephone Encounter (Signed)
Called Sarah giving [facility]ba order thru voice message for physician's approval for wound care.

## 2022-11-19 ENCOUNTER — Ambulatory Visit (INDEPENDENT_AMBULATORY_CARE_PROVIDER_SITE_OTHER): Payer: Medicare PPO | Admitting: Podiatry

## 2022-11-19 ENCOUNTER — Encounter: Payer: Self-pay | Admitting: Podiatry

## 2022-11-19 DIAGNOSIS — T8130XA Disruption of wound, unspecified, initial encounter: Secondary | ICD-10-CM

## 2022-11-19 DIAGNOSIS — I739 Peripheral vascular disease, unspecified: Secondary | ICD-10-CM

## 2022-11-19 DIAGNOSIS — Z9889 Other specified postprocedural states: Secondary | ICD-10-CM

## 2022-11-19 NOTE — Progress Notes (Signed)
  Subjective:  Patient ID: Evan Nelson, male    DOB: 04-29-1961,  MRN: [id number]  No chief complaint on file.   DOS: 09/11/22 Procedure: Left fifth toe amputation  62 y.o. male returns for POV#5. Relates doing well and pain improved.   Review of Systems: Negative except as noted in the HPI. Denies N/V/F/Ch.  Past Medical History:  Diagnosis Date   Diabetes mellitus without complication    Hypertension    Peripheral arterial disease     Current Outpatient Medications:    acetaminophen (TYLENOL) 500 MG tablet, Take 2 tablets (1,000 mg total) by mouth every 6 (six) hours as needed for moderate pain., Disp: 30 tablet, Rfl: 0   aspirin EC 81 MG tablet, Take 1 tablet (81 mg total) by mouth daily. Swallow whole., Disp: 30 tablet, Rfl: 12   atorvastatin (LIPITOR) 40 MG tablet, Take 1 tablet (40 mg total) by mouth at bedtime., Disp: 60 tablet, Rfl: 0   Blood Glucose Monitoring Suppl (ACCU-CHEK GUIDE ME) w/Device KIT, Use in the morning, at noon, and at bedtime., Disp: 1 kit, Rfl: 0   clopidogrel (PLAVIX) 75 MG tablet, Take 1 tablet (75 mg total) by mouth daily with breakfast., Disp: 60 tablet, Rfl: 0   docusate sodium (COLACE) 100 MG capsule, Take 1 capsule (100 mg total) by mouth 2 (two) times daily., Disp: 10 capsule, Rfl: 0   glipiZIDE (GLUCOTROL) 5 MG tablet, Take 1/2 tablet (2.5 mg total) by mouth 2 (two) times daily., Disp: 30 tablet, Rfl: 11   glucose blood (PIP BLOOD GLUCOSE TEST STRIP) test strip, Use as instructed, Disp: 100 each, Rfl: 12   metFORMIN (GLUCOPHAGE) 1000 MG tablet, Take 1 tablet (1,000 mg total) by mouth 2 (two) times daily with a meal., Disp: 180 tablet, Rfl: 3  Social History   Tobacco Use  Smoking Status Every Day   Types: Cigarettes  Smokeless Tobacco Never    No Known Allergies Objective:  There were no vitals filed for this visit. There is no height or weight on file to calculate BMI. Constitutional Well developed. Well nourished.  Vascular Foot  warm and well perfused. Capillary refill normal to all digits.   Neurologic Normal speech. Oriented to person, place, and time. Epicritic sensation to light touch grossly present bilaterally.  Dermatologic Dehiscence noted to incision site with necrotic wound developing overlying and progressive to midfoot area. Does not appear to be worse from previous having some improvement.  No erythema edema or purulence noted.   Orthopedic: Tenderness to palpation noted about the surgical site.   Radiographs: Interval resection of left fifth ray.  Assessment:   1. Postoperative state   2. Dehiscence of wound   3. Peripheral vascular disease       Plan:  Patient was evaluated and treated and all questions answered.  S/p foot surgery left -Progressing as expected post-operatively. -WB Status: WBAT in surgical shoe  -Discussed daily betadine dressing changes for wound.   -Medications: n/a -Foot redressed. Follow-up in two weeks for recheck.   No follow-ups on file.

## 2022-11-20 ENCOUNTER — Telehealth: Payer: Self-pay

## 2022-11-20 NOTE — Progress Notes (Signed)
  Care Coordination  Note  11/20/2022 Name: Evan Nelson MRN: [id number] DOB: Oct 28, 1960  Evan Nelson is a 62 y.o. year old male who is a primary care patient of Ellamae Sia, Aleen Campi, FNP. I reached out to Evan Nelson by phone today to offer care coordination services.      Mr. Lancon was given information about Care Coordination services today including:  The Care Coordination services include support from the care team which includes your Nurse Coordinator, Clinical Social Worker, or Pharmacist.  The Care Coordination team is here to help remove barriers to the health concerns and goals most important to you. Care Coordination services are voluntary and the patient may decline or stop services at any time by request to their care team member.   Patient agreed to services and verbal consent obtained.   Follow up plan: Telephone appointment with care coordination team member scheduled for:12/14/2022  Penne Lash, RMA [facility]  [place], [place] [address] Direct Dial: [phone] [email or web address]

## 2022-12-04 ENCOUNTER — Ambulatory Visit (INDEPENDENT_AMBULATORY_CARE_PROVIDER_SITE_OTHER): Payer: Medicare PPO | Admitting: Podiatry

## 2022-12-04 ENCOUNTER — Encounter: Payer: Self-pay | Admitting: Podiatry

## 2022-12-04 DIAGNOSIS — I739 Peripheral vascular disease, unspecified: Secondary | ICD-10-CM

## 2022-12-04 DIAGNOSIS — T8130XA Disruption of wound, unspecified, initial encounter: Secondary | ICD-10-CM

## 2022-12-04 DIAGNOSIS — Z9889 Other specified postprocedural states: Secondary | ICD-10-CM

## 2022-12-04 NOTE — Progress Notes (Signed)
  Subjective:  Patient ID: Evan Nelson, male    DOB: Jan 10, 1961,  MRN: [id number]  Chief Complaint  Patient presents with   Wound Check    Left foot wound check , patient states he thinks his foot is getting better since the last visit. Still doing daily dressing changes    DOS: 09/11/22 Procedure: Left fifth toe amputation  62 y.o. male returns for POV#5. Relates doing well and pain improved.   Review of Systems: Negative except as noted in the HPI. Denies N/V/F/Ch.  Past Medical History:  Diagnosis Date   Diabetes mellitus without complication (HCC)    Hypertension    Peripheral arterial disease (HCC)     Current Outpatient Medications:    acetaminophen (TYLENOL) 500 MG tablet, Take 2 tablets (1,000 mg total) by mouth every 6 (six) hours as needed for moderate pain., Disp: 30 tablet, Rfl: 0   aspirin EC 81 MG tablet, Take 1 tablet (81 mg total) by mouth daily. Swallow whole., Disp: 30 tablet, Rfl: 12   atorvastatin (LIPITOR) 40 MG tablet, Take 1 tablet (40 mg total) by mouth at bedtime., Disp: 60 tablet, Rfl: 0   Blood Glucose Monitoring Suppl (ACCU-CHEK GUIDE ME) w/Device KIT, Use in the morning, at noon, and at bedtime., Disp: 1 kit, Rfl: 0   clopidogrel (PLAVIX) 75 MG tablet, Take 1 tablet (75 mg total) by mouth daily with breakfast., Disp: 60 tablet, Rfl: 0   docusate sodium (COLACE) 100 MG capsule, Take 1 capsule (100 mg total) by mouth 2 (two) times daily., Disp: 10 capsule, Rfl: 0   glipiZIDE (GLUCOTROL) 5 MG tablet, Take 1/2 tablet (2.5 mg total) by mouth 2 (two) times daily., Disp: 30 tablet, Rfl: 11   glucose blood (PIP BLOOD GLUCOSE TEST STRIP) test strip, Use as instructed, Disp: 100 each, Rfl: 12   metFORMIN (GLUCOPHAGE) 1000 MG tablet, Take 1 tablet (1,000 mg total) by mouth 2 (two) times daily with a meal., Disp: 180 tablet, Rfl: 3  Social History   Tobacco Use  Smoking Status Every Day   Types: Cigarettes  Smokeless Tobacco Never    No Known  Allergies Objective:  There were no vitals filed for this visit. There is no height or weight on file to calculate BMI. Constitutional Well developed. Well nourished.  Vascular Foot warm and well perfused. Capillary refill normal to all digits.   Neurologic Normal speech. Oriented to person, place, and time. Epicritic sensation to light touch grossly present bilaterally.  Dermatologic Dehiscence noted to incision site with necrotic wound developing overlying and progressive to midfoot area. Does not appear to be worse from previous. There has been improvement since last visit.  No erythema edema or purulence noted.   Orthopedic: Tenderness to palpation noted about the surgical site.   Radiographs: Interval resection of left fifth ray.  Assessment:   1. Postoperative state   2. Dehiscence of wound   3. Peripheral vascular disease (HCC)        Plan:  Patient was evaluated and treated and all questions answered.  S/p foot surgery left -Progressing as expected post-operatively. -WB Status: WBAT in surgical shoe  -Discussed daily betadine dressing changes for wound.   -Medications: n/a -Foot redressed. Follow-up in two weeks for recheck.   No follow-ups on file.

## 2022-12-06 ENCOUNTER — Ambulatory Visit (INDEPENDENT_AMBULATORY_CARE_PROVIDER_SITE_OTHER): Payer: Medicare PPO | Admitting: Family Medicine

## 2022-12-06 ENCOUNTER — Encounter: Payer: Self-pay | Admitting: Family Medicine

## 2022-12-06 VITALS — BP 129/84 | HR 100 | Temp 98.0°F | Ht 67.0 in | Wt 199.0 lb

## 2022-12-06 DIAGNOSIS — Z7984 Long term (current) use of oral hypoglycemic drugs: Secondary | ICD-10-CM

## 2022-12-06 DIAGNOSIS — M86172 Other acute osteomyelitis, left ankle and foot: Secondary | ICD-10-CM | POA: Diagnosis not present

## 2022-12-06 DIAGNOSIS — R899 Unspecified abnormal finding in specimens from other organs, systems and tissues: Secondary | ICD-10-CM | POA: Diagnosis not present

## 2022-12-06 DIAGNOSIS — E785 Hyperlipidemia, unspecified: Secondary | ICD-10-CM

## 2022-12-06 DIAGNOSIS — E1169 Type 2 diabetes mellitus with other specified complication: Secondary | ICD-10-CM | POA: Diagnosis not present

## 2022-12-06 DIAGNOSIS — E1151 Type 2 diabetes mellitus with diabetic peripheral angiopathy without gangrene: Secondary | ICD-10-CM

## 2022-12-06 MED ORDER — ACETAMINOPHEN 500 MG PO TABS
1000.0000 mg | ORAL_TABLET | Freq: Four times a day (QID) | ORAL | 0 refills | Status: AC | PRN
Start: 2022-12-06 — End: 2023-03-06

## 2022-12-06 MED ORDER — CLOPIDOGREL BISULFATE 75 MG PO TABS
75.0000 mg | ORAL_TABLET | Freq: Every day | ORAL | 0 refills | Status: AC
Start: 2022-12-06 — End: 2023-02-04

## 2022-12-06 MED ORDER — ATORVASTATIN CALCIUM 40 MG PO TABS
40.0000 mg | ORAL_TABLET | Freq: Every day | ORAL | 0 refills | Status: DC
Start: 2022-12-06 — End: 2023-04-02

## 2022-12-06 MED ORDER — ASPIRIN 81 MG PO TBEC
81.0000 mg | DELAYED_RELEASE_TABLET | Freq: Every day | ORAL | 12 refills | Status: DC
Start: 2022-12-06 — End: 2023-12-12

## 2022-12-06 NOTE — Progress Notes (Signed)
Acute Office Visit  Subjective:  Patient ID: Evan Nelson, male    DOB: 1961/03/19, 62 y.o.   MRN: [id number]  Chief Complaint  Patient presents with   Medical Management of Chronic Issues    Need refills    HPI Patient is in today for follow up of chronic conditions Continues to follow up with vascular for PAD   Type 2 Diabetes with PAD Glucometer:Accucheck.   High at home: 119; Low at home: 39, Taking medication(s): denies side effects, 100% compliance, glipizide and metformin,.  Last eye exam: due next month  Last foot exam: through vascular team/completed  Last A1c:  Lab Results  Component Value Date   HGBA1C 8.6 (H) 09/09/2022   Nephropathy screen indicated?: Yes  Last flu, zoster and/or pneumovax:  There is no immunization history on file for this patient.  ROS: Denies dizziness, LOC, polyuria, polydipsia, unintended weight loss/gain, foot ulcerations, numbness or tingling in extremities, shortness of breath or chest pain.  Mixed Hyperlipidemia  CAD risks of male, overweight, DM, PAD.  Tolerating statin   Not fasting today for labs.  Has plans for daughter and grandson (75) to come to town next week.  Very proud of grandson for his academic achievements.   Objective:  BP 129/84   Pulse 100   Temp 98 F (36.7 C)   Ht 5\' 7"  (1.702 m)   Wt 199 lb (90.3 kg)   SpO2 100%   BMI 31.17 kg/m    Physical Exam Constitutional:      General: He is not in acute distress.    Appearance: Normal appearance. He is well-developed, well-groomed and overweight. He is not ill-appearing, toxic-appearing or diaphoretic.  Eyes:     Comments: Wears glasses   Cardiovascular:     Rate and Rhythm: Normal rate.     Pulses: Normal pulses.     Heart sounds: Normal heart sounds. No murmur heard.    No gallop.  Pulmonary:     Effort: Pulmonary effort is normal. No respiratory distress.     Breath sounds: Normal breath sounds. No stridor. No wheezing, rhonchi or rales.   Musculoskeletal:     Comments: Walks with walker  Feet:     Comments: Not in a boot today Skin:    General: Skin is warm.     Capillary Refill: Capillary refill takes less than 2 seconds.  Neurological:     General: No focal deficit present.     Mental Status: He is alert and oriented to person, place, and time. Mental status is at baseline.     GCS: GCS eye subscore is 4. GCS verbal subscore is 5. GCS motor subscore is 6.     Motor: No weakness.  Psychiatric:        Attention and Perception: Attention and perception normal.        Mood and Affect: Mood and affect normal.        Speech: Speech normal.        Behavior: Behavior normal. Behavior is cooperative.        Thought Content: Thought content normal.        Cognition and Memory: Cognition and memory normal.        Judgment: Judgment normal.       12/06/2022   10:40 AM  Depression screen PHQ 2/9  Decreased Interest 0  Down, Depressed, Hopeless 0  PHQ - 2 Score 0  Altered sleeping 0  Tired, decreased energy 0  Change in  appetite 0  Feeling bad or failure about yourself  0  Trouble concentrating 0  Moving slowly or fidgety/restless 0  Suicidal thoughts 0  PHQ-9 Score 0  Difficult doing work/chores Not difficult at all      12/06/2022   10:40 AM  GAD 7 : Generalized Anxiety Score  Nervous, Anxious, on Edge 0  Control/stop worrying 0  Worry too much - different things 0  Trouble relaxing 0  Restless 0  Easily annoyed or irritable 0  Afraid - awful might happen 0  Total GAD 7 Score 0  Anxiety Difficulty Not difficult at all   Assessment & Plan:  1. Type 2 diabetes mellitus with other specified complication, without long-term current use of insulin (HCC) Home BG readings within goal. Will collect A1C in 2 weeks with additional lab draws. Due for nephrology screen will collect today. Patient is not fasting and plans to come back when he is fasting. Did not need refills at this time. Tolerating metformin and  glipizide well.  - Microalbumin / creatinine urine ratio - CBC with Differential/Platelet; Future - CMP14+EGFR; Future - Bayer DCA Hb A1c Waived; Future - Vitamin B12; Future - TSH; Future  2. Peripheral vascular disease in diabetes mellitus (HCC) Continues to follow with Vascular. Per their note, would like him to stay on Dual Anticoagulation for at least 3 months. Will bridge him until his appointment with them in June to determine if he needs to continue on dual therapy. Will continue statin and collect labs when fasting as above.  - aspirin EC 81 MG tablet; Take 1 tablet (81 mg total) by mouth daily. Swallow whole.  Dispense: 30 tablet; Refill: 12 - atorvastatin (LIPITOR) 40 MG tablet; Take 1 tablet (40 mg total) by mouth at bedtime.  Dispense: 90 tablet; Refill: 0 - clopidogrel (PLAVIX) 75 MG tablet; Take 1 tablet (75 mg total) by mouth daily with breakfast.  Dispense: 60 tablet; Refill: 0  3. Acute osteomyelitis of toe, left (HCC) Refill as below. Continues to have some pain at site. Well controlled on current regimen.  - acetaminophen (TYLENOL) 500 MG tablet; Take 2 tablets (1,000 mg total) by mouth every 6 (six) hours as needed for moderate pain.  Dispense: 180 tablet; Refill: 0  5. Hyperlipidemia associated with type 2 diabetes mellitus (HCC) Labs as below. Will communicate results to patient once available.  Will collect when fasting.  - Lipid panel; Future  The above assessment and management plan was discussed with the patient. The patient verbalized understanding of and has agreed to the management plan using shared-decision making. Patient is aware to call the clinic if they develop any new symptoms or if symptoms fail to improve or worsen. Patient is aware when to return to the clinic for a follow-up visit. Patient educated on when it is appropriate to go to the emergency department.   Return in about 3 months (around 03/08/2023).  Neale Burly, DNP-FNP [facility] [address] [place], [place] [address] [phone]

## 2022-12-07 LAB — MICROALBUMIN / CREATININE URINE RATIO
Creatinine, Urine: 127.9 mg/dL
Microalb/Creat Ratio: 96 mg/g creat — ABNORMAL HIGH (ref 0–29)
Microalbumin, Urine: 123.1 ug/mL

## 2022-12-11 ENCOUNTER — Telehealth: Payer: Self-pay | Admitting: Family Medicine

## 2022-12-11 NOTE — Addendum Note (Signed)
Addended by: Neale Burly on: 12/11/2022 10:18 AM   Modules accepted: Orders

## 2022-12-11 NOTE — Progress Notes (Signed)
Will repeat in 3-6 months, then plan to start SGLT2 based on results.

## 2022-12-11 NOTE — Telephone Encounter (Signed)
Called patient to schedule Medicare Annual Wellness Visit (AWV). No voicemail available to leave a message.  Last date of AWV: due 09/28/2022 awvi per [facility]  Please schedule an appointment at any time with Vernona Rieger, [facility]. .  If any questions, please contact me at [phone].  Thank you,  Judeth Cornfield,  AMB Clinical Support [facility] AWV Program Direct Dial ??[phone]

## 2022-12-12 ENCOUNTER — Telehealth: Payer: Self-pay | Admitting: *Deleted

## 2022-12-12 NOTE — Telephone Encounter (Signed)
Evan Nelson has been updated.

## 2022-12-12 NOTE — Telephone Encounter (Signed)
Approved.  

## 2022-12-12 NOTE — Telephone Encounter (Signed)
[facility] nurse is calling for an a approval order to continue skilled nursing visits for 1 x weekly for 4 more weeks for wound accessment, dressing changes,please advise.

## 2022-12-14 ENCOUNTER — Telehealth: Payer: Medicare PPO

## 2022-12-19 ENCOUNTER — Encounter: Payer: Self-pay | Admitting: Podiatry

## 2022-12-19 ENCOUNTER — Ambulatory Visit (INDEPENDENT_AMBULATORY_CARE_PROVIDER_SITE_OTHER): Payer: Medicare PPO | Admitting: Podiatry

## 2022-12-19 DIAGNOSIS — E1169 Type 2 diabetes mellitus with other specified complication: Secondary | ICD-10-CM

## 2022-12-19 DIAGNOSIS — B351 Tinea unguium: Secondary | ICD-10-CM | POA: Diagnosis not present

## 2022-12-19 DIAGNOSIS — M79675 Pain in left toe(s): Secondary | ICD-10-CM | POA: Diagnosis not present

## 2022-12-19 DIAGNOSIS — Z9889 Other specified postprocedural states: Secondary | ICD-10-CM

## 2022-12-19 DIAGNOSIS — I739 Peripheral vascular disease, unspecified: Secondary | ICD-10-CM

## 2022-12-19 DIAGNOSIS — T8130XA Disruption of wound, unspecified, initial encounter: Secondary | ICD-10-CM

## 2022-12-19 DIAGNOSIS — M79674 Pain in right toe(s): Secondary | ICD-10-CM

## 2022-12-19 NOTE — Progress Notes (Signed)
  Subjective:  Patient ID: Evan Nelson, male    DOB: 06-Nov-1960,  MRN: [id number]  Chief Complaint  Patient presents with   Routine Post Op    Left amputation check. Pt states he is doing well. Feels like it is healing.    Nail Problem    RFC bilateral nail trim.     DOS: 09/11/22 Procedure: Left fifth toe amputation  62 y.o. male returns for POV#6. Relates doing well and pain improved. Requesting nail trim today.   Review of Systems: Negative except as noted in the HPI. Denies N/V/F/Ch.  Past Medical History:  Diagnosis Date   Hypertension    Peripheral arterial disease (HCC)    Type 2 diabetes mellitus with other specified complication (HCC)     Current Outpatient Medications:    acetaminophen (TYLENOL) 500 MG tablet, Take 2 tablets (1,000 mg total) by mouth every 6 (six) hours as needed for moderate pain., Disp: 180 tablet, Rfl: 0   aspirin EC 81 MG tablet, Take 1 tablet (81 mg total) by mouth daily. Swallow whole., Disp: 30 tablet, Rfl: 12   atorvastatin (LIPITOR) 40 MG tablet, Take 1 tablet (40 mg total) by mouth at bedtime., Disp: 90 tablet, Rfl: 0   Blood Glucose Monitoring Suppl (ACCU-CHEK GUIDE ME) w/Device KIT, Use in the morning, at noon, and at bedtime., Disp: 1 kit, Rfl: 0   clopidogrel (PLAVIX) 75 MG tablet, Take 1 tablet (75 mg total) by mouth daily with breakfast., Disp: 60 tablet, Rfl: 0   docusate sodium (COLACE) 100 MG capsule, Take 1 capsule (100 mg total) by mouth 2 (two) times daily., Disp: 10 capsule, Rfl: 0   glipiZIDE (GLUCOTROL) 5 MG tablet, Take 1/2 tablet (2.5 mg total) by mouth 2 (two) times daily., Disp: 30 tablet, Rfl: 11   glucose blood (PIP BLOOD GLUCOSE TEST STRIP) test strip, Use as instructed, Disp: 100 each, Rfl: 12   metFORMIN (GLUCOPHAGE) 1000 MG tablet, Take 1 tablet (1,000 mg total) by mouth 2 (two) times daily with a meal., Disp: 180 tablet, Rfl: 3  Social History   Tobacco Use  Smoking Status Every Day   Types: Cigarettes   Smokeless Tobacco Never    No Known Allergies Objective:  There were no vitals filed for this visit. There is no height or weight on file to calculate BMI. Constitutional Well developed. Well nourished.  Vascular Foot warm and well perfused. Capillary refill normal to all digits.   Neurologic Normal speech. Oriented to person, place, and time. Epicritic sensation to light touch grossly present bilaterally.  Dermatologic Dehiscence noted to incision site with necrotic wound developing overlying and progressive to midfoot area. Does not appear to be worse from previous. There has been improvement since last visit.  No erythema edema or purulence noted.   Orthopedic: Tenderness to palpation noted about the surgical site.   Radiographs: Interval resection of left fifth ray.  Assessment:   1. Postoperative state   2. Dehiscence of wound   3. Peripheral vascular disease (HCC)        Plan:  Patient was evaluated and treated and all questions answered.  S/p foot surgery left -Progressing as expected post-operatively. -WB Status: WBAT in surgical shoe  -Discussed daily betadine dressing changes for wound.   -Medications: n/a -Foot redressed. -Nails 1-5 debrided bilateral without incident.  Follow-up in two weeks for recheck.   No follow-ups on file.

## 2022-12-25 ENCOUNTER — Ambulatory Visit (INDEPENDENT_AMBULATORY_CARE_PROVIDER_SITE_OTHER): Payer: Medicare PPO | Admitting: Pharmacist

## 2022-12-25 DIAGNOSIS — E1169 Type 2 diabetes mellitus with other specified complication: Secondary | ICD-10-CM

## 2022-12-25 DIAGNOSIS — Z7984 Long term (current) use of oral hypoglycemic drugs: Secondary | ICD-10-CM

## 2022-12-25 NOTE — Progress Notes (Signed)
    S:    PCP: Neale Burly, FNP  62 y.o. male who presents for diabetes evaluation, education, and management. PMH is significant for T2DM, HLD, and PAD. Patient was referred by Primary Care Provider, Neale Burly, on 11/08/2022.   At last visit, metformin was increased to 1000 mg BID.   Today, patient is in good spirits. Reports BG readings 98-107. States he has not been taking glipizide (last dispensed for a 30 day supply on 10/09/2022). Patient agreeable to starting SGLT2i for cardioprotection given PAD.    Patient reports Diabetes was diagnosed in February 2024.   Current diabetes medications include: metformin 1000 mg BID Current hyperlipidemia medications include: atorvastatin 40 mg daily  Patient DENIES adherence to all medications- reports he has not been taking glipizide. He thought he was taken off this medication.   Insurance coverage: [employer]  Patient denies hypoglycemic events.  Reported home fasting blood sugars: 98-107  Reported 2 hour post-meal/random blood sugars: 107-108.  Home BP readings: 120/70s  Patient denies nocturia (nighttime urination).  Patient denies neuropathy (nerve pain). Patient denies visual changes.  Patient reported dietary habits: Eats 3 meals/day Breakfast: 1 bread, eggs, banana Lunch: boiled chicken, carrots, broccoli  Dinner: fish, veggies, sweet potato Snacks: peanut butter  Drinks: water, pepsi zero   O:   Lab Results  Component Value Date   HGBA1C 8.6 (H) 09/09/2022   There were no vitals filed for this visit.  Lipid Panel     Component Value Date/Time   CHOL 155 09/11/2022 0433   TRIG 76 09/11/2022 0433   HDL 28 (L) 09/11/2022 0433   CHOLHDL 5.5 09/11/2022 0433   VLDL 15 09/11/2022 0433   LDLCALC 112 (H) 09/11/2022 0433    Clinical Atherosclerotic Cardiovascular Disease (ASCVD): Yes - PAD The 10-year ASCVD risk score (Arnett DK, et al., 2019) is: 30.1%   Values used to calculate the score:     Age:  31 years     Sex: Male     Is Non-Hispanic African American: Yes     Diabetic: Yes     Tobacco smoker: Yes     Systolic Blood Pressure: 129 mmHg     Is BP treated: No     HDL Cholesterol: 28 mg/dL     Total Cholesterol: 155 mg/dL    A/P: Diabetes newly diagnosed, currently uncontrolled based on A1c but improved per patient report. Patient is able to verbalize appropriate hypoglycemia management plan. Adherent to metformin, but reports he has not been taking glipizide. Patient is agreeable to starting SGLT2i for cardioprotection if affordable.  -Started SGLT2-I Farxiga (dapagliflozin) 10 mg. Counseled on sick day rules. -Continued metformin 1000 mg BID. -Instructed patient it is ok to hold glipizide for now, but if Marcelline Deist is unaffordable, we may have to restart.  -Patient educated on purpose, proper use, and potential adverse effects of [place].  -Extensively discussed pathophysiology of diabetes, recommended lifestyle interventions, dietary effects on blood sugar control.  -Counseled on s/sx of and management of hypoglycemia.  -Next A1c anticipated next PCP visit (August 2024).   Patient verbalized understanding of treatment plan.  Total time counseling 30 minutes.    Follow-up:  Pharmacist 3-4 weeks to determine if Marcelline Deist is affordable. PCP clinic visit in August 2024.   Valeda Malm, Pharm.D. PGY-2 Ambulatory Care Pharmacy Resident

## 2023-01-02 ENCOUNTER — Ambulatory Visit (INDEPENDENT_AMBULATORY_CARE_PROVIDER_SITE_OTHER): Payer: Medicare PPO | Admitting: Podiatry

## 2023-01-02 ENCOUNTER — Encounter: Payer: Self-pay | Admitting: Podiatry

## 2023-01-02 DIAGNOSIS — I739 Peripheral vascular disease, unspecified: Secondary | ICD-10-CM

## 2023-01-02 DIAGNOSIS — E1169 Type 2 diabetes mellitus with other specified complication: Secondary | ICD-10-CM | POA: Diagnosis not present

## 2023-01-02 DIAGNOSIS — Z9889 Other specified postprocedural states: Secondary | ICD-10-CM

## 2023-01-02 DIAGNOSIS — L97522 Non-pressure chronic ulcer of other part of left foot with fat layer exposed: Secondary | ICD-10-CM

## 2023-01-02 NOTE — Progress Notes (Addendum)
Subjective:  Patient ID: Evan Nelson, male    DOB: 03-06-1961,  MRN: [id number]  Chief Complaint  Patient presents with   Wound Check    Wound check patient states wound is getting better no discharge and states he continues to do dressing changes     DOS: 09/11/22 Procedure: Left fifth toe amputation  62 y.o. male returns for POV#7. Relates doing well and pain improved.   Review of Systems: Negative except as noted in the HPI. Denies N/V/F/Ch.  Past Medical History:  Diagnosis Date   Hypertension    Peripheral arterial disease (HCC)    Type 2 diabetes mellitus with other specified complication (HCC)     Current Outpatient Medications:    acetaminophen (TYLENOL) 500 MG tablet, Take 2 tablets (1,000 mg total) by mouth every 6 (six) hours as needed for moderate pain., Disp: 180 tablet, Rfl: 0   aspirin EC 81 MG tablet, Take 1 tablet (81 mg total) by mouth daily. Swallow whole., Disp: 30 tablet, Rfl: 12   atorvastatin (LIPITOR) 40 MG tablet, Take 1 tablet (40 mg total) by mouth at bedtime., Disp: 90 tablet, Rfl: 0   Blood Glucose Monitoring Suppl (ACCU-CHEK GUIDE ME) w/Device KIT, Use in the morning, at noon, and at bedtime., Disp: 1 kit, Rfl: 0   clopidogrel (PLAVIX) 75 MG tablet, Take 1 tablet (75 mg total) by mouth daily with breakfast., Disp: 60 tablet, Rfl: 0   docusate sodium (COLACE) 100 MG capsule, Take 1 capsule (100 mg total) by mouth 2 (two) times daily., Disp: 10 capsule, Rfl: 0   glipiZIDE (GLUCOTROL) 5 MG tablet, Take 1/2 tablet (2.5 mg total) by mouth 2 (two) times daily. (Patient not taking: Reported on 12/25/2022), Disp: 30 tablet, Rfl: 11   glucose blood (PIP BLOOD GLUCOSE TEST STRIP) test strip, Use as instructed, Disp: 100 each, Rfl: 12   metFORMIN (GLUCOPHAGE) 1000 MG tablet, Take 1 tablet (1,000 mg total) by mouth 2 (two) times daily with a meal., Disp: 180 tablet, Rfl: 3  Social History   Tobacco Use  Smoking Status Every Day   Types: Cigarettes   Smokeless Tobacco Never    No Known Allergies Objective:  There were no vitals filed for this visit. There is no height or weight on file to calculate BMI. Constitutional Well developed. Well nourished.  Vascular Foot warm and well perfused. Capillary refill normal to all digits.   Neurologic Normal speech. Oriented to person, place, and time. Epicritic sensation to light touch grossly present bilaterally.  Dermatologic Dehiscence noted to incision site with necrotic wound developing overlying and progressive to midfoot area. Does not appear to be worse from previous. There has been improvement since last visit.  No erythema edema or purulence noted. Measuring about 3 cm x 4 cm x 0.3 cm   Orthopedic: Tenderness to palpation noted about the surgical site.   Radiographs: Interval resection of left fifth ray.  Assessment:   1. Postoperative state   2. Dehiscence of wound   3. Peripheral vascular disease (HCC)        Plan:  Patient was evaluated and treated and all questions answered.  S/p foot surgery left -Progressing as expected post-operatively. -WB Status: WBAT in surgical shoe  Ulcer left foot with fat layer exposed  -Debridement as below. -Dressed with betadine, DSD. -Off-loading with surgical shoe. -No abx indicated.  -Discussed glucose control and proper protein-rich diet.  -Discussed if any worsening redness, pain, fever or chills to call or may need to  report to the emergency room. Patient expressed understanding.   Procedure: Excisional Debridement of Wound Rationale: Removal of non-viable soft tissue from the wound to promote healing.  Anesthesia: none Pre-Debridement Wound Measurements: overlying slough Post-Debridement Wound Measurements: 3 cm x 4 cm x 0.3 cm  Type of Debridement: Sharp Excisional Tissue Removed: Non-viable soft tissue Depth of Debridement: subcutaneous tissue. Technique: Sharp excisional debridement to bleeding, viable wound base.   Dressing: Dry, sterile, compression dressing. Disposition: Patient tolerated procedure well. Patient to return in 2 week for follow-up.  Return if symptoms worsen or fail to improve.    No follow-ups on file.

## 2023-01-03 MED ORDER — DAPAGLIFLOZIN PROPANEDIOL 10 MG PO TABS
10.0000 mg | ORAL_TABLET | Freq: Every day | ORAL | 11 refills | Status: DC
Start: 2023-01-03 — End: 2023-01-08

## 2023-01-08 ENCOUNTER — Telehealth: Payer: Self-pay | Admitting: Family Medicine

## 2023-01-08 ENCOUNTER — Other Ambulatory Visit: Payer: Self-pay | Admitting: *Deleted

## 2023-01-08 DIAGNOSIS — E1169 Type 2 diabetes mellitus with other specified complication: Secondary | ICD-10-CM

## 2023-01-08 MED ORDER — DAPAGLIFLOZIN PROPANEDIOL 10 MG PO TABS
10.0000 mg | ORAL_TABLET | Freq: Every day | ORAL | 11 refills | Status: DC
Start: 2023-01-08 — End: 2023-03-22

## 2023-01-08 NOTE — Telephone Encounter (Signed)
Confirmed by patient. He would like rx redirected to mail order.

## 2023-01-14 ENCOUNTER — Encounter: Payer: Self-pay | Admitting: Surgery

## 2023-01-14 ENCOUNTER — Ambulatory Visit (INDEPENDENT_AMBULATORY_CARE_PROVIDER_SITE_OTHER): Payer: Medicare PPO | Admitting: Surgery

## 2023-01-14 ENCOUNTER — Ambulatory Visit (INDEPENDENT_AMBULATORY_CARE_PROVIDER_SITE_OTHER)
Admission: RE | Admit: 2023-01-14 | Discharge: 2023-01-14 | Disposition: A | Discharge status: Home / Self Care | Payer: Medicare PPO | Source: Ambulatory Visit | Attending: Surgery | Admitting: Surgery

## 2023-01-14 ENCOUNTER — Ambulatory Visit (HOSPITAL_COMMUNITY)
Admission: RE | Admit: 2023-01-14 | Discharge: 2023-01-14 | Disposition: A | Discharge status: Home / Self Care | Payer: Medicare PPO | Source: Ambulatory Visit | Attending: Surgery | Admitting: Surgery

## 2023-01-14 VITALS — BP 144/80 | HR 110 | Temp 97.8°F | Resp 16 | Ht 67.0 in | Wt 200.0 lb

## 2023-01-14 DIAGNOSIS — I7025 Atherosclerosis of native arteries of other extremities with ulceration: Secondary | ICD-10-CM | POA: Diagnosis not present

## 2023-01-14 LAB — VAS US ABI WITH/WO TBI
Left ABI: 0.65
Right ABI: 0.75

## 2023-01-14 NOTE — Progress Notes (Signed)
[facility] and [facility]in [facility]ist of [facility]  Patient name: Evan Nelson MRN: [id number] DOB: 04-09-1961 Sex: male   REASON FOR VISIT:    Follow up  HISOTRY OF PRESENT ILLNESS:    Evan Nelson is a 62 y.o. male who went to the emergency department on 09/08/2022 with pain and swelling in his left foot.  He had bumped his toe about 6 months prior and then dropped something on it again a few months later.  He had a nonhealing wound since that time.  He had been on antibiotics.  His toe was turning more black and so he came to the hospital.  On 09/10/2022, he underwent angiography by Dr. Karin Lieu.  He was found to have a left SFA occlusion which was treated with overlapping 7 mm Eluvia stents followed by angioplasty of the anterior tibial and posterior tibial arteries.  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  He continues to have a wound that is being debrided.   The patient is a current smoker. His blood sugars were elevated and he is being worked up for diabetes    PAST MEDICAL HISTORY:   Past Medical History:  Diagnosis Date   Hypertension    Peripheral arterial disease (HCC)    Type 2 diabetes mellitus with other specified complication (HCC)      FAMILY HISTORY:   No family history on file.  SOCIAL HISTORY:   Social History   Tobacco Use   Smoking status: Every Day    Types: Cigarettes   Smokeless tobacco: Never  Substance Use Topics   Alcohol use: Not Currently     ALLERGIES:   No Known Allergies   CURRENT MEDICATIONS:   Current Outpatient Medications  Medication Sig Dispense Refill   acetaminophen (TYLENOL) 500 MG tablet Take 2 tablets (1,000 mg total) by mouth every 6 (six) hours as needed for moderate pain. 180 tablet 0   aspirin EC 81 MG tablet Take 1 tablet (81 mg total) by mouth daily. Swallow whole. 30 tablet 12   atorvastatin (LIPITOR) 40 MG tablet Take 1 tablet (40 mg total) by mouth at bedtime. 90  tablet 0   Blood Glucose Monitoring Suppl (ACCU-CHEK GUIDE ME) w/Device KIT Use in the morning, at noon, and at bedtime. 1 kit 0   clopidogrel (PLAVIX) 75 MG tablet Take 1 tablet (75 mg total) by mouth daily with breakfast. 60 tablet 0   dapagliflozin propanediol (FARXIGA) 10 MG TABS tablet Take 1 tablet (10 mg total) by mouth daily before breakfast. 30 tablet 11   docusate sodium (COLACE) 100 MG capsule Take 1 capsule (100 mg total) by mouth 2 (two) times daily. 10 capsule 0   glipiZIDE (GLUCOTROL) 5 MG tablet Take 1/2 tablet (2.5 mg total) by mouth 2 (two) times daily. (Patient not taking: Reported on 12/25/2022) 30 tablet 11   glucose blood (PIP BLOOD GLUCOSE TEST STRIP) test strip Use as instructed 100 each 12   metFORMIN (GLUCOPHAGE) 1000 MG tablet Take 1 tablet (1,000 mg total) by mouth 2 (two) times daily with a meal. 180 tablet 3   No current facility-administered medications for this visit.    REVIEW OF SYSTEMS:   [X]  denotes positive finding, [ ]  denotes negative finding Cardiac  Comments:  Chest pain or chest pressure:    Shortness of breath upon exertion:    Short of breath when lying flat:    Irregular heart rhythm:        Vascular  Pain in calf, thigh, or hip brought on by ambulation:    Pain in feet at night that wakes you up from your sleep:     Blood clot in your veins:    Leg swelling:         Pulmonary    Oxygen at home:    Productive cough:     Wheezing:         Neurologic    Sudden weakness in arms or legs:     Sudden numbness in arms or legs:     Sudden onset of difficulty speaking or slurred speech:    Temporary loss of vision in one eye:     Problems with dizziness:         Gastrointestinal    Blood in stool:     Vomited blood:         Genitourinary    Burning when urinating:     Blood in urine:        Psychiatric    Major depression:         Hematologic    Bleeding problems:    Problems with blood clotting too easily:        Skin     Rashes or ulcers:        Constitutional    Fever or chills:      PHYSICAL EXAM:   There were no vitals filed for this visit.  GENERAL: The patient is a well-nourished male, in no acute distress. The vital signs are documented above. CARDIAC: There is a regular rate and rhythm.  VASCULAR: Nonpalpable pedal pulses PULMONARY: Non-labored respirations MUSCULOSKELETAL: There are no major deformities or cyanosis. NEUROLOGIC: No focal weakness or paresthesias are detected. SKIN: See photo below PSYCHIATRIC: The patient has a normal affect.  STUDIES:   I have reviewed the following: +-------+-----------+-----------+------------+------------+  ABI/TBIToday's ABIToday's TBIPrevious ABIPrevious TBI  +-------+-----------+-----------+------------+------------+  Right 0.75       0.34       0.73        0.40          +-------+-----------+-----------+------------+------------+  Left  0.65       0.45       0.96        0.79          +-------+-----------+-----------+------------+------------+  Left Stent(s):  +---------------+--------+--------+----------+--------+  Left SFA stent PSV cm/sStenosisWaveform  Comments  +---------------+--------+--------+----------+--------+  Prox to Stent  117             monophasicbrisk     +---------------+--------+--------+----------+--------+  Proximal Stent 93              monophasicbrisk     +---------------+--------+--------+----------+--------+  Mid Stent      72              triphasic           +---------------+--------+--------+----------+--------+  Distal Stent   65              triphasic           +---------------+--------+--------+----------+--------+  Distal to Stent137             monophasicbrisk     +---------------+--------+--------+----------+--------+     Summary:  Left: Patent stent with no evidence of stenosis in the superficial femoral  artery.      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  He also has suboptimal toe pressures, 64 on the left.  Since he is yet to heal his wound, and has had a drop in his ABIs despite a patent stent, I think he needs to undergo angiography to make sure that he is get adequate blood flow for healing.  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 The patient is on best medical therapy for peripheral arterial disease. The patient has been counseled about the risks of tobacco use in atherosclerotic disease. The patient has been counseled to abstain from any tobacco use. An aortogram with bilateral lower extremity runoff angiography and Left lower extremity intervention and is indicated to better evaluate the patient's lower extremity circulation because of the limb threatening nature of the patient's diagnosis. Based on the patient's clinical exam and non-invasive data, we anticipate an endovascular intervention in the femoral-popliteal and tibial vessels stenting and/or  athrectomy would be favored because of the improved primary patency of these interventions as compared to plain balloon angioplasty.     Charlena Cross, MD, FACS [facility] of [facility] [phone] Pager [phone]

## 2023-01-14 NOTE — H&P (View-Only) (Signed)
Vascular and Vein Specialist of [facility]  Patient name: Evan Nelson MRN: [id number] DOB: 04-09-1961 Sex: male   REASON FOR VISIT:    Follow up  HISOTRY OF PRESENT ILLNESS:    Evan Nelson is a 62 y.o. male who went to the emergency department on 09/08/2022 with pain and swelling in his left foot.  He had bumped his toe about 6 months prior and then dropped something on it again a few months later.  He had a nonhealing wound since that time.  He had been on antibiotics.  His toe was turning more black and so he came to the hospital.  On 09/10/2022, he underwent angiography by Dr. Karin Lieu.  He was found to have a left SFA occlusion which was treated with overlapping 7 mm Eluvia stents followed by angioplasty of the anterior tibial and posterior tibial arteries.  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  He continues to have a wound that is being debrided.   The patient is a current smoker. His blood sugars were elevated and he is being worked up for diabetes    PAST MEDICAL HISTORY:   Past Medical History:  Diagnosis Date   Hypertension    Peripheral arterial disease (HCC)    Type 2 diabetes mellitus with other specified complication (HCC)      FAMILY HISTORY:   No family history on file.  SOCIAL HISTORY:   Social History   Tobacco Use   Smoking status: Every Day    Types: Cigarettes   Smokeless tobacco: Never  Substance Use Topics   Alcohol use: Not Currently     ALLERGIES:   No Known Allergies   CURRENT MEDICATIONS:   Current Outpatient Medications  Medication Sig Dispense Refill   acetaminophen (TYLENOL) 500 MG tablet Take 2 tablets (1,000 mg total) by mouth every 6 (six) hours as needed for moderate pain. 180 tablet 0   aspirin EC 81 MG tablet Take 1 tablet (81 mg total) by mouth daily. Swallow whole. 30 tablet 12   atorvastatin (LIPITOR) 40 MG tablet Take 1 tablet (40 mg total) by mouth at bedtime. 90  tablet 0   Blood Glucose Monitoring Suppl (ACCU-CHEK GUIDE ME) w/Device KIT Use in the morning, at noon, and at bedtime. 1 kit 0   clopidogrel (PLAVIX) 75 MG tablet Take 1 tablet (75 mg total) by mouth daily with breakfast. 60 tablet 0   dapagliflozin propanediol (FARXIGA) 10 MG TABS tablet Take 1 tablet (10 mg total) by mouth daily before breakfast. 30 tablet 11   docusate sodium (COLACE) 100 MG capsule Take 1 capsule (100 mg total) by mouth 2 (two) times daily. 10 capsule 0   glipiZIDE (GLUCOTROL) 5 MG tablet Take 1/2 tablet (2.5 mg total) by mouth 2 (two) times daily. (Patient not taking: Reported on 12/25/2022) 30 tablet 11   glucose blood (PIP BLOOD GLUCOSE TEST STRIP) test strip Use as instructed 100 each 12   metFORMIN (GLUCOPHAGE) 1000 MG tablet Take 1 tablet (1,000 mg total) by mouth 2 (two) times daily with a meal. 180 tablet 3   No current facility-administered medications for this visit.    REVIEW OF SYSTEMS:   [X]  denotes positive finding, [ ]  denotes negative finding Cardiac  Comments:  Chest pain or chest pressure:    Shortness of breath upon exertion:    Short of breath when lying flat:    Irregular heart rhythm:        Vascular  Pain in calf, thigh, or hip brought on by ambulation:    Pain in feet at night that wakes you up from your sleep:     Blood clot in your veins:    Leg swelling:         Pulmonary    Oxygen at home:    Productive cough:     Wheezing:         Neurologic    Sudden weakness in arms or legs:     Sudden numbness in arms or legs:     Sudden onset of difficulty speaking or slurred speech:    Temporary loss of vision in one eye:     Problems with dizziness:         Gastrointestinal    Blood in stool:     Vomited blood:         Genitourinary    Burning when urinating:     Blood in urine:        Psychiatric    Major depression:         Hematologic    Bleeding problems:    Problems with blood clotting too easily:        Skin     Rashes or ulcers:        Constitutional    Fever or chills:      PHYSICAL EXAM:   There were no vitals filed for this visit.  GENERAL: The patient is a well-nourished male, in no acute distress. The vital signs are documented above. CARDIAC: There is a regular rate and rhythm.  VASCULAR: Nonpalpable pedal pulses PULMONARY: Non-labored respirations MUSCULOSKELETAL: There are no major deformities or cyanosis. NEUROLOGIC: No focal weakness or paresthesias are detected. SKIN: See photo below PSYCHIATRIC: The patient has a normal affect.  STUDIES:   I have reviewed the following: +-------+-----------+-----------+------------+------------+  ABI/TBIToday's ABIToday's TBIPrevious ABIPrevious TBI  +-------+-----------+-----------+------------+------------+  Right 0.75       0.34       0.73        0.40          +-------+-----------+-----------+------------+------------+  Left  0.65       0.45       0.96        0.79          +-------+-----------+-----------+------------+------------+  Left Stent(s):  +---------------+--------+--------+----------+--------+  Left SFA stent PSV cm/sStenosisWaveform  Comments  +---------------+--------+--------+----------+--------+  Prox to Stent  117             monophasicbrisk     +---------------+--------+--------+----------+--------+  Proximal Stent 93              monophasicbrisk     +---------------+--------+--------+----------+--------+  Mid Stent      72              triphasic           +---------------+--------+--------+----------+--------+  Distal Stent   65              triphasic           +---------------+--------+--------+----------+--------+  Distal to Stent137             monophasicbrisk     +---------------+--------+--------+----------+--------+     Summary:  Left: Patent stent with no evidence of stenosis in the superficial femoral  artery.      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  He also has suboptimal toe pressures, 64 on the left.  Since he is yet to heal his wound, and has had a drop in his ABIs despite a patent stent, I think he needs to undergo angiography to make sure that he is get adequate blood flow for healing.  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 The patient is on best medical therapy for peripheral arterial disease. The patient has been counseled about the risks of tobacco use in atherosclerotic disease. The patient has been counseled to abstain from any tobacco use. An aortogram with bilateral lower extremity runoff angiography and Left lower extremity intervention and is indicated to better evaluate the patient's lower extremity circulation because of the limb threatening nature of the patient's diagnosis. Based on the patient's clinical exam and non-invasive data, we anticipate an endovascular intervention in the femoral-popliteal and tibial vessels stenting and/or  athrectomy would be favored because of the improved primary patency of these interventions as compared to plain balloon angioplasty.     Charlena Cross, MD, FACS [facility] [phone] Pager [phone]

## 2023-01-16 ENCOUNTER — Other Ambulatory Visit: Payer: Self-pay

## 2023-01-16 DIAGNOSIS — I7025 Atherosclerosis of native arteries of other extremities with ulceration: Secondary | ICD-10-CM

## 2023-01-23 ENCOUNTER — Telehealth: Payer: Self-pay | Admitting: Podiatry

## 2023-01-23 ENCOUNTER — Other Ambulatory Visit: Payer: Self-pay

## 2023-01-23 DIAGNOSIS — I7025 Atherosclerosis of native arteries of other extremities with ulceration: Secondary | ICD-10-CM

## 2023-01-23 NOTE — Telephone Encounter (Signed)
PT CALLED IN TO REQUEST FOR DR. Ralene Cork TO [facility] AT [phone]. HE HAD SURGERY AND A NURSE IS COMING TO HIS HOME FROM [facility] TO ASSIST IN WOUND CARE.PT STATES THAT HE NEEDS ADDITIONAL CARE FROM [facility], BUT NEEDS DR Ralene Cork TO CALL AND GIVE INFORMATION FOR ADDITIONAL VISITS.

## 2023-01-24 ENCOUNTER — Ambulatory Visit (INDEPENDENT_AMBULATORY_CARE_PROVIDER_SITE_OTHER): Payer: Medicare PPO

## 2023-01-24 ENCOUNTER — Other Ambulatory Visit: Payer: Self-pay | Admitting: Podiatry

## 2023-01-24 VITALS — Ht 67.0 in | Wt 190.0 lb

## 2023-01-24 DIAGNOSIS — Z0000 Encounter for general adult medical examination without abnormal findings: Secondary | ICD-10-CM | POA: Diagnosis not present

## 2023-01-24 DIAGNOSIS — L97522 Non-pressure chronic ulcer of other part of left foot with fat layer exposed: Secondary | ICD-10-CM

## 2023-01-24 DIAGNOSIS — I739 Peripheral vascular disease, unspecified: Secondary | ICD-10-CM

## 2023-01-24 DIAGNOSIS — Z1211 Encounter for screening for malignant neoplasm of colon: Secondary | ICD-10-CM

## 2023-01-24 DIAGNOSIS — E1169 Type 2 diabetes mellitus with other specified complication: Secondary | ICD-10-CM

## 2023-01-24 DIAGNOSIS — T8130XA Disruption of wound, unspecified, initial encounter: Secondary | ICD-10-CM

## 2023-01-24 NOTE — Telephone Encounter (Signed)
I placed a new referral.  thanks

## 2023-01-24 NOTE — Progress Notes (Signed)
Subjective:   Evan Nelson is a 62 y.o. male who presents for an Initial Medicare Annual Wellness Visit.  Visit Complete: Virtual  I connected with  [name] on 01/24/23 by a audio enabled telemedicine application and verified that I am speaking with the correct person using two identifiers.  Patient Location: Home  Provider Location: Home Office  I discussed the limitations of evaluation and management by telemedicine. The patient expressed understanding and agreed to proceed.  Patient Medicare AWV questionnaire was completed by the patient on 01/24/2023; I have confirmed that all information answered by patient is correct and no changes since this date.  Review of Systems    Nutrition Risk Assessment:  Has the patient had any N/V/D within the last 2 months?  No  Does the patient have any non-healing wounds?  Yes  Has the patient had any unintentional weight loss or weight gain?  No   Diabetes:  Is the patient diabetic?  Yes  If diabetic, was a CBG obtained today?  No  Did the patient bring in their glucometer from home?  No  How often do you monitor your CBG's? 3x day .   Financial Strains and Diabetes Management:  Are you having any financial strains with the device, your supplies or your medication? No .  Does the patient want to be seen by Chronic Care Management for management of their diabetes?  No  Would the patient like to be referred to a Nutritionist or for Diabetic Management?  No   Diabetic Exams:  Diabetic Eye Exam: Completed 07/2022 Diabetic Foot Exam: Overdue, Pt has been advised about the importance in completing this exam. Pt is scheduled for diabetic foot exam on next office visit .  Cardiac Risk Factors include: advanced age (>36men, >59 women);diabetes mellitus;dyslipidemia;male gender;hypertension     Objective:    Today's Vitals   01/24/23 0919  Weight: 190 lb (86.2 kg)  Height: 5\' 7"  (1.702 m)   Body mass index is 29.76  kg/m.     01/24/2023    9:22 AM 10/08/2022    6:03 PM 09/11/2022    1:30 PM 09/08/2022   12:34 PM  Advanced Directives  Does Patient Have a Medical Advance Directive? No No No No  Would patient like information on creating a medical advance directive? No - Patient declined  No - Patient declined No - Patient declined    Current Medications (verified) Outpatient Encounter Medications as of 01/24/2023  Medication Sig   acetaminophen (TYLENOL) 500 MG tablet Take 2 tablets (1,000 mg total) by mouth every 6 (six) hours as needed for moderate pain.   aspirin EC 81 MG tablet Take 1 tablet (81 mg total) by mouth daily. Swallow whole.   atorvastatin (LIPITOR) 40 MG tablet Take 1 tablet (40 mg total) by mouth at bedtime.   Blood Glucose Monitoring Suppl (ACCU-CHEK GUIDE ME) w/Device KIT Use in the morning, at noon, and at bedtime.   clopidogrel (PLAVIX) 75 MG tablet Take 1 tablet (75 mg total) by mouth daily with breakfast.   dapagliflozin propanediol (FARXIGA) 10 MG TABS tablet Take 1 tablet (10 mg total) by mouth daily before breakfast.   docusate sodium (COLACE) 100 MG capsule Take 1 capsule (100 mg total) by mouth 2 (two) times daily.   glipiZIDE (GLUCOTROL) 5 MG tablet Take 1/2 tablet (2.5 mg total) by mouth 2 (two) times daily.   glucose blood (PIP BLOOD GLUCOSE TEST STRIP) test strip Use as instructed   metFORMIN (  GLUCOPHAGE) 1000 MG tablet Take 1 tablet (1,000 mg total) by mouth 2 (two) times daily with a meal.   No facility-administered encounter medications on file as of 01/24/2023.    Allergies (verified) Patient has no known allergies.   History: Past Medical History:  Diagnosis Date   Hypertension    Peripheral arterial disease (HCC)    Type 2 diabetes mellitus with other specified complication [facility])    Past Surgical History:  Procedure Laterality Date   ABDOMINAL AORTOGRAM W/LOWER EXTREMITY N/A 09/10/2022   Procedure: ABDOMINAL AORTOGRAM W/LOWER EXTREMITY;  Surgeon: Victorino Sparrow, MD;  Location: [facility] INVASIVE CV LAB;  Service: Cardiovascular;  Laterality: N/A;   AMPUTATION TOE Left 09/11/2022   Procedure: AMPUTATION LEFT FIFTH TOE;  Surgeon: Louann Sjogren, DPM;  Location: [facility] OR;  Service: Podiatry;  Laterality: Left;   PERIPHERAL VASCULAR BALLOON ANGIOPLASTY Left 09/10/2022   Procedure: PERIPHERAL VASCULAR BALLOON ANGIOPLASTY;  Surgeon: Victorino Sparrow, MD;  Location[facility] INVAS[facility] CV LAB;  Service: Cardiovascular;  Laterality: Left;  Tibials   PERIPHERAL VASCULAR INTERVENTION Left 09/10/2022   Procedure: PERIPHERAL VASCULAR INTERVENTION;  Surgeon: Victorino Sparrow, MD;  Location: [facility] INVASIVE CV LAB;  Service: Cardiovascular;  Laterality: Left;  SFA   History reviewed. No pertinent family history. Social History   Socioeconomic History   Marital status: Divorced    Spouse name: Not on file   Number of children: Not on file   Years of education: Not on file   Highest education level: Not on file  Occupational History   Not on file  Tobacco Use   Smoking status: Every Day    Types: Cigarettes   Smokeless tobacco: Never  Substance and Sexual Activity   Alcohol use: Not Currently   Drug use: Not on file   Sexual activity: Not on file  Other Topics Concern   Not on file  Social History Narrative   Not on file   Social Determinants of Health   Financial Resource Strain: Low Risk  (01/24/2023)   Overall Financial Resource Strain (CARDIA)    Difficulty of Paying Living Expenses: Not hard at all  Food Insecurity: No Food Insecurity (01/24/2023)   Hunger Vital Sign    Worried About Running Out of Food in the Last Year: Never true    Ran Out of Food in the Last Year: Never true  Transportation Needs: No Transportation Needs (01/24/2023)   PRAPARE - Administrator, Civil Service (Medical): No    Lack of Transportation (Non-Medical): No  Physical Activity: Insufficiently Active (01/24/2023)   Exercise Vital Sign    Days of Exercise per Week: 3  days    Minutes of Exercise per Session: 30 min  Stress: No Stress Concern Present (01/24/2023)   [employer] of Occupational Health - Occupational Stress Questionnaire    Feeling of Stress : Not at all  Social Connections: Moderately Isolated (01/24/2023)   Social Connection and Isolation Panel [NHANES]    Frequency of Communication with Friends and Family: More than three times a week    Frequency of Social Gatherings with Friends and Family: More than three times a week    Attends Religious Services: More than 4 times per year    Active Member of [employer] or Organizations: No    Attends Banker Meetings: Never    Marital Status: Divorced    Tobacco Counseling Ready to quit: Not Answered Counseling given: Not Answered   Clinical Intake:  Pre-visit preparation completed:  Yes  Pain : No/denies pain     Nutritional Risks: None Diabetes: Yes CBG done?: No Did pt. bring in CBG monitor from home?: No  How often do you need to have someone help you when you read instructions, pamphlets, or other written materials from your doctor or pharmacy?: 1 - Never  Interpreter Needed?: No  Information entered by :: Renie Ora, LPN   Activities of Daily Living    01/24/2023    9:23 AM 09/08/2022    9:47 PM  In your present state of health, do you have any difficulty performing the following activities:  Hearing? 0   Vision? 0   Difficulty concentrating or making decisions? 0   Walking or climbing stairs? 0   Dressing or bathing? 0   Doing errands, shopping? 0 0  Preparing Food and eating ? N   Using the Toilet? N   In the past six months, have you accidently leaked urine? N   Do you have problems with loss of bowel control? N   Managing your Medications? N   Managing your Finances? N   Housekeeping or managing your Housekeeping? N     Patient Care Team: Milian, Aleen Campi, FNP as PCP - General (Family Medicine)  Indicate any recent Medical Services  you may have received from other than [facility] providers in the past year (date may be approximate).     Assessment:   This is a routine wellness examination for Ahan.  Hearing/Vision screen Vision Screening - Comments:: Wears rx glasses - up to date with routine eye exams with  Dr.Martin   Dietary issues and exercise activities discussed:     Goals Addressed             This Visit's Progress    Exercise 3x per week (30 min per time)         Depression Screen    01/24/2023    9:22 AM 12/06/2022   10:40 AM  PHQ 2/9 Scores  PHQ - 2 Score 0 0  PHQ- 9 Score 0 0    Fall Risk    01/24/2023    9:20 AM 12/06/2022   10:40 AM  Fall Risk   Falls in the past year? 0 0  Number falls in past yr: 0 0  Injury with Fall? 0 0  Risk for fall due to : No Fall Risks No Fall Risks  Follow up Falls prevention discussed Falls evaluation completed    MEDICARE RISK AT HOME:  Medicare Risk at Home - 01/24/23 0920     Any stairs in or around the home? No    If so, are there any without handrails? No    Home free of loose throw rugs in walkways, pet beds, electrical cords, etc? Yes    Adequate lighting in your home to reduce risk of falls? Yes    Life alert? No    Use of a cane, walker or w/c? No    Grab bars in the bathroom? Yes    Shower chair or bench in shower? Yes    Elevated toilet seat or a handicapped toilet? Yes             TIMED UP AND GO:  Was the test performed? No    Cognitive Function:        01/24/2023    9:23 AM  6CIT Screen  What Year? 0 points  What month? 0 points  What time? 0 points  Count back from  20 0 points  Months in reverse 0 points  Repeat phrase 0 points  Total Score 0 points    Immunizations  There is no immunization history on file for this patient.  TDAP status: Due, Education has been provided regarding the importance of this vaccine. Advised may receive this vaccine at local pharmacy or Health Dept. Aware to provide a copy of the  vaccination record if obtained from local pharmacy or Health Dept. Verbalized acceptance and understanding.  Flu Vaccine status: Declined, Education has been provided regarding the importance of this vaccine but patient still declined. Advised may receive this vaccine at local pharmacy or Health Dept. Aware to provide a copy of the vaccination record if obtained from local pharmacy or Health Dept. Verbalized acceptance and understanding.  Pneumococcal vaccine status: Due, Education has been provided regarding the importance of this vaccine. Advised may receive this vaccine at local pharmacy or Health Dept. Aware to provide a copy of the vaccination record if obtained from local pharmacy or Health Dept. Verbalized acceptance and understanding.  Covid-19 vaccine status: Completed vaccines  Qualifies for Shingles Vaccine? Yes   Zostavax completed No   Shingrix Completed?: No.    Education has been provided regarding the importance of this vaccine. Patient has been advised to call insurance company to determine out of pocket expense if they have not yet received this vaccine. Advised may also receive vaccine at local pharmacy or Health Dept. Verbalized acceptance and understanding.  Screening Tests Health Maintenance  Topic Date Due   COVID-19 Vaccine (1) Never done   FOOT EXAM  Never done   OPHTHALMOLOGY EXAM  Never done   DTaP/Tdap/Td (1 - Tdap) Never done   Zoster Vaccines- Shingrix (1 of 2) 02/07/2023 (Originally 03/27/2009)   Colonoscopy  11/08/2023 (Originally 03/27/2004)   Hepatitis C Screening  11/08/2023 (Originally [birth year]1977)   INFLUENZA VACCINE  02/28/2023   HEMOGLOBIN A1C  03/10/2023   Diabetic kidney evaluation - eGFR measurement  11/08/2023   Diabetic kidney evaluation - Urine ACR  12/06/2023   Medicare Annual Wellness (AWV)  01/24/2024   HIV Screening  Completed   HPV VACCINES  Aged Out    Health Maintenance  Health Maintenance Due  Topic Date Due   COVID-19 Vaccine (1)  Never done   FOOT EXAM  Never done   OPHTHALMOLOGY EXAM  Never done   DTaP/Tdap/Td (1 - Tdap) Never done    Colorectal cancer screening: Referral to GI placed cologuard 01/24/2023. Pt aware the office will call re: appt.  Lung Cancer Screening: (Low Dose CT Chest recommended if Age 74-80 years, 20 pack-year currently smoking OR have quit w/in 15years.) does not qualify.   Lung Cancer Screening Referral: n/a  Additional Screening:  Hepatitis C Screening: does not qualify;   Vision Screening: Recommended annual ophthalmology exams for early detection of glaucoma and other disorders of the eye. Is the patient up to date with their annual eye exam?  Yes  Who is the provider or what is the name of the office in which the patient attends annual eye exams? Dr.Martin  If pt is not established with a provider, would they like to be referred to a provider to establish care? No .   Dental Screening: Recommended annual dental exams for proper oral hygiene  Diabetic Foot Exam: Diabetic Foot Exam: Overdue, Pt has been advised about the importance in completing this exam. Pt is scheduled for diabetic foot exam on next office visit .  Community Resource Referral / Chronic  Care Management: CRR required this visit?  No   CCM required this visit?  No    Plan:     I have personally reviewed and noted the following in the patient's chart:   Medical and social history Use of alcohol, tobacco or illicit drugs  Current medications and supplements including opioid prescriptions. Patient is not currently taking opioid prescriptions. Functional ability and status Nutritional status Physical activity Advanced directives List of other physicians Hospitalizations, surgeries, and ER visits in previous 12 months Vitals Screenings to include cognitive, depression, and falls Referrals and appointments  In addition, I have reviewed and discussed with patient certain preventive protocols, quality  metrics, and best practice recommendations. A written personalized care plan for preventive services as well as general preventive health recommendations were provided to patient.     Lorrene Reid, LPN   [id number]   After Visit Summary: (MyChart) Due to this being a telephonic visit, the after visit summary with patients personalized plan was offered to patient via MyChart   Nurse Notes: No vaccines on File

## 2023-01-24 NOTE — Telephone Encounter (Signed)
Kathie Rhodes ,nurse with [facility] said that they will be needing a new order for patient if wanting them to do any care, was discharged last week. If wanting new orders , please submit.

## 2023-01-24 NOTE — Patient Instructions (Signed)
Mr. Baca , Thank you for taking time to come for your Medicare Wellness Visit. I appreciate your ongoing commitment to your health goals. Please review the following plan we discussed and let me know if I can assist you in the future.   These are the goals we discussed:  Goals      Exercise 3x per week (30 min per time)        This is a list of the screening recommended for you and due dates:  Health Maintenance  Topic Date Due   COVID-19 Vaccine (1) Never done   Complete foot exam   Never done   Eye exam for diabetics  Never done   DTaP/Tdap/Td vaccine (1 - Tdap) Never done   Zoster (Shingles) Vaccine (1 of 2) 02/07/2023*   Colon Cancer Screening  11/08/2023*   Hepatitis C Screening  11/08/2023*   Flu Shot  02/28/2023   Hemoglobin A1C  03/10/2023   Yearly kidney function blood test for diabetes  11/08/2023   Yearly kidney health urinalysis for diabetes  12/06/2023   Medicare Annual Wellness Visit  01/24/2024   HIV Screening  Completed   HPV Vaccine  Aged Out  *Topic was postponed. The date shown is not the original due date.    Advanced directives: Advance directive discussed with you today. I have provided a copy for you to complete at home and have notarized. Once this is complete please bring a copy in to our office so we can scan it into your chart. Information on Advanced Care Planning can be found at [facility] [place] Advance Health Care Directives Advance Health Care Directives ([email or web address]/)    Conditions/risks identified: Aim for 30 minutes of exercise or brisk walking, 6-8 glasses of water, and 5 servings of fruits and vegetables each day.   Next appointment: Follow up in one year for your annual wellness visit   Preventive Care 40-64 Years, Male Preventive care refers to lifestyle choices and visits with your health care provider that can promote health and wellness. What does preventive care include? A yearly physical exam. This is also called an  annual well check. Dental exams once or twice a year. Routine eye exams. Ask your health care provider how often you should have your eyes checked. Personal lifestyle choices, including: Daily care of your teeth and gums. Regular physical activity. Eating a healthy diet. Avoiding tobacco and drug use. Limiting alcohol use. Practicing safe sex. Taking low-dose aspirin every day starting at age 33. What happens during an annual well check? The services and screenings done by your health care provider during your annual well check will depend on your age, overall health, lifestyle risk factors, and family history of disease. Counseling  Your health care provider may ask you questions about your: Alcohol use. Tobacco use. Drug use. Emotional well-being. Home and relationship well-being. Sexual activity. Eating habits. Work and work Astronomer. Screening  You may have the following tests or measurements: Height, weight, and BMI. Blood pressure. Lipid and cholesterol levels. These may be checked every 5 years, or more frequently if you are over 49 years old. Skin check. Lung cancer screening. You may have this screening every year starting at age 87 if you have a 30-pack-year history of smoking and currently smoke or have quit within the past 15 years. Fecal occult blood test (FOBT) of the stool. You may have this test every year starting at age 74. Flexible sigmoidoscopy or colonoscopy. You may have a sigmoidoscopy  every 5 years or a colonoscopy every 10 years starting at age 84. Prostate cancer screening. Recommendations will vary depending on your family history and other risks. Hepatitis C blood test. Hepatitis B blood test. Sexually transmitted disease (STD) testing. Diabetes screening. This is done by checking your blood sugar (glucose) after you have not eaten for a while (fasting). You may have this done every 1-3 years. Discuss your test results, treatment options, and if  necessary, the need for more tests with your health care provider. Vaccines  Your health care provider may recommend certain vaccines, such as: Influenza vaccine. This is recommended every year. Tetanus, diphtheria, and acellular pertussis (Tdap, Td) vaccine. You may need a Td booster every 10 years. Zoster vaccine. You may need this after age 56. Pneumococcal 13-valent conjugate (PCV13) vaccine. You may need this if you have certain conditions and have not been vaccinated. Pneumococcal polysaccharide (PPSV23) vaccine. You may need one or two doses if you smoke cigarettes or if you have certain conditions. Talk to your health care provider about which screenings and vaccines you need and how often you need them. This information is not intended to replace advice given to you by your health care provider. Make sure you discuss any questions you have with your health care provider. Document Released: 08/12/2015 Document Revised: 04/04/2016 Document Reviewed: 05/17/2015 Elsevier Interactive Patient Education  2017 [employer].  Fall Prevention in the Home Falls can cause injuries. They can happen to people of all ages. There are many things you can do to make your home safe and to help prevent falls. What can I do on the outside of my home? Regularly fix the edges of walkways and driveways and fix any cracks. Remove anything that might make you trip as you walk through a door, such as a raised step or threshold. Trim any bushes or trees on the path to your home. Use bright outdoor lighting. Clear any walking paths of anything that might make someone trip, such as rocks or tools. Regularly check to see if handrails are loose or broken. Make sure that both sides of any steps have handrails. Any raised decks and porches should have guardrails on the edges. Have any leaves, snow, or ice cleared regularly. Use sand or salt on walking paths during winter. Clean up any spills in your garage right  away. This includes oil or grease spills. What can I do in the bathroom? Use night lights. Install grab bars by the toilet and in the tub and shower. Do not use towel bars as grab bars. Use non-skid mats or decals in the tub or shower. If you need to sit down in the shower, use a plastic, non-slip stool. Keep the floor dry. Clean up any water that spills on the floor as soon as it happens. Remove soap buildup in the tub or shower regularly. Attach bath mats securely with double-sided non-slip rug tape. Do not have throw rugs and other things on the floor that can make you trip. What can I do in the bedroom? Use night lights. Make sure that you have a light by your bed that is easy to reach. Do not use any sheets or blankets that are too big for your bed. They should not hang down onto the floor. Have a firm chair that has side arms. You can use this for support while you get dressed. Do not have throw rugs and other things on the floor that can make you trip. What can I  do in the kitchen? Clean up any spills right away. Avoid walking on wet floors. Keep items that you use a lot in easy-to-reach places. If you need to reach something above you, use a strong step stool that has a grab bar. Keep electrical cords out of the way. Do not use floor polish or wax that makes floors slippery. If you must use wax, use non-skid floor wax. Do not have throw rugs and other things on the floor that can make you trip. What can I do with my stairs? Do not leave any items on the stairs. Make sure that there are handrails on both sides of the stairs and use them. Fix handrails that are broken or loose. Make sure that handrails are as long as the stairways. Check any carpeting to make sure that it is firmly attached to the stairs. Fix any carpet that is loose or worn. Avoid having throw rugs at the top or bottom of the stairs. If you do have throw rugs, attach them to the floor with carpet tape. Make sure  that you have a light switch at the top of the stairs and the bottom of the stairs. If you do not have them, ask someone to add them for you. What else can I do to help prevent falls? Wear shoes that: Do not have high heels. Have rubber bottoms. Are comfortable and fit you well. Are closed at the toe. Do not wear sandals. If you use a stepladder: Make sure that it is fully opened. Do not climb a closed stepladder. Make sure that both sides of the stepladder are locked into place. Ask someone to hold it for you, if possible. Clearly mark and make sure that you can see: Any grab bars or handrails. First and last steps. Where the edge of each step is. Use tools that help you move around (mobility aids) if they are needed. These include: Canes. Walkers. Scooters. Crutches. Turn on the lights when you go into a dark area. Replace any light bulbs as soon as they burn out. Set up your furniture so you have a clear path. Avoid moving your furniture around. If any of your floors are uneven, fix them. If there are any pets around you, be aware of where they are. Review your medicines with your doctor. Some medicines can make you feel dizzy. This can increase your chance of falling. Ask your doctor what other things that you can do to help prevent falls. This information is not intended to replace advice given to you by your health care provider. Make sure you discuss any questions you have with your health care provider. Document Released: 05/12/2009 Document Revised: 12/22/2015 Document Reviewed: 08/20/2014 Elsevier Interactive Patient Education  2017 [employer].

## 2023-01-28 ENCOUNTER — Encounter: Payer: Self-pay | Admitting: Podiatry

## 2023-01-28 ENCOUNTER — Ambulatory Visit (INDEPENDENT_AMBULATORY_CARE_PROVIDER_SITE_OTHER): Payer: Medicare PPO | Admitting: Podiatry

## 2023-01-28 DIAGNOSIS — I739 Peripheral vascular disease, unspecified: Secondary | ICD-10-CM

## 2023-01-28 DIAGNOSIS — E1169 Type 2 diabetes mellitus with other specified complication: Secondary | ICD-10-CM

## 2023-01-28 DIAGNOSIS — L97522 Non-pressure chronic ulcer of other part of left foot with fat layer exposed: Secondary | ICD-10-CM

## 2023-01-28 NOTE — Progress Notes (Signed)
Subjective:  Patient ID: Evan Nelson, male    DOB: [birth year],  MRN: [id number]  Chief Complaint  Patient presents with   Wound Check    Left foot wound check     DOS: 09/11/22 Procedure: Left fifth toe amputation  62 y.o. male returns for POV#8. Relates doing well and pain improved.   Review of Systems: Negative except as noted in the HPI. Denies N/V/F/Ch.  Past Medical History:  Diagnosis Date   Hypertension    Peripheral arterial disease (HCC)    Type 2 diabetes mellitus with other specified complication (HCC)     Current Outpatient Medications:    acetaminophen (TYLENOL) 500 MG tablet, Take 2 tablets (1,000 mg total) by mouth every 6 (six) hours as needed for moderate pain., Disp: 180 tablet, Rfl: 0   aspirin EC 81 MG tablet, Take 1 tablet (81 mg total) by mouth daily. Swallow whole., Disp: 30 tablet, Rfl: 12   atorvastatin (LIPITOR) 40 MG tablet, Take 1 tablet (40 mg total) by mouth at bedtime., Disp: 90 tablet, Rfl: 0   Blood Glucose Monitoring Suppl (ACCU-CHEK GUIDE ME) w/Device KIT, Use in the morning, at noon, and at bedtime., Disp: 1 kit, Rfl: 0   clopidogrel (PLAVIX) 75 MG tablet, Take 1 tablet (75 mg total) by mouth daily with breakfast., Disp: 60 tablet, Rfl: 0   dapagliflozin propanediol (FARXIGA) 10 MG TABS tablet, Take 1 tablet (10 mg total) by mouth daily before breakfast., Disp: 30 tablet, Rfl: 11   docusate sodium (COLACE) 100 MG capsule, Take 1 capsule (100 mg total) by mouth 2 (two) times daily., Disp: 10 capsule, Rfl: 0   glipiZIDE (GLUCOTROL) 5 MG tablet, Take 1/2 tablet (2.5 mg total) by mouth 2 (two) times daily., Disp: 30 tablet, Rfl: 11   glucose blood (PIP BLOOD GLUCOSE TEST STRIP) test strip, Use as instructed, Disp: 100 each, Rfl: 12   metFORMIN (GLUCOPHAGE) 1000 MG tablet, Take 1 tablet (1,000 mg total) by mouth 2 (two) times daily with a meal., Disp: 180 tablet, Rfl: 3  Social History   Tobacco Use  Smoking Status Every Day   Types:  Cigarettes  Smokeless Tobacco Never    No Known Allergies Objective:  There were no vitals filed for this visit. There is no height or weight on file to calculate BMI. Constitutional Well developed. Well nourished.  Vascular Foot warm and well perfused. Capillary refill normal to all digits.   Neurologic Normal speech. Oriented to person, place, and time. Epicritic sensation to light touch grossly present bilaterally.  Dermatologic Dehiscence noted to incision site with necrotic wound developing overlying and progressive to midfoot area. Does not appear to be worse from previous. There has been improvement since last visit.  No erythema edema or purulence noted. Measuring about 2 cm x 2 cm x 0.3 cm   Orthopedic: Tenderness to palpation noted about the surgical site.   Radiographs: Interval resection of left fifth ray.  Assessment:   1. 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 Peripheral vascular disease (HCC)   3. Type 2 diabetes mellitus with other specified complication, without long-term current use of insulin (HCC)        Plan:  Patient was evaluated and treated and all questions answered.  S/p foot surgery left -Progressing as expected post-operatively. -WB Status: WBAT in surgical shoe  Ulcer left foot with fat layer exposed  -Debridement as below. -Dressed with betadine, DSD. -Off-loading with surgical shoe. -No abx indicated.  -  Discussed glucose control and proper protein-rich diet.  -Discussed if any worsening redness, pain, fever or chills to call or may need to report to the emergency room. Patient expressed understanding.   Procedure: Excisional Debridement of Wound Rationale: Removal of non-viable soft tissue from the wound to promote healing.  Anesthesia: none Pre-Debridement Wound Measurements: overlying slough Post-Debridement Wound Measurements: 2 cm x 2 cm x 0.3 cm  Type of Debridement: Sharp Excisional Tissue Removed: Non-viable soft  tissue Depth of Debridement: subcutaneous tissue. Technique: Sharp excisional debridement to bleeding, viable wound base.  Dressing: Dry, sterile, compression dressing. Disposition: Patient tolerated procedure well. Patient to return in 2 week for follow-up.  No follow-ups on file.    No follow-ups on file.

## 2023-01-29 ENCOUNTER — Telehealth: Payer: Self-pay | Admitting: *Deleted

## 2023-01-29 NOTE — Telephone Encounter (Signed)
Faxed the home health order to [facility] , attnKathie Rhodes, confirmation received 01/29/23

## 2023-02-07 NOTE — Progress Notes (Signed)
Patient having procedure in [facility] lab on 7/16. He called PAT office to ask if he should take his [place] the [place]. I advised him to call Dr. Estanislado Spire office at [phone]. He said he would do that .

## 2023-02-11 ENCOUNTER — Ambulatory Visit (INDEPENDENT_AMBULATORY_CARE_PROVIDER_SITE_OTHER): Payer: Medicare PPO | Admitting: Podiatry

## 2023-02-11 ENCOUNTER — Encounter: Payer: Self-pay | Admitting: Podiatry

## 2023-02-11 DIAGNOSIS — E1169 Type 2 diabetes mellitus with other specified complication: Secondary | ICD-10-CM | POA: Diagnosis not present

## 2023-02-11 DIAGNOSIS — L97522 Non-pressure chronic ulcer of other part of left foot with fat layer exposed: Secondary | ICD-10-CM

## 2023-02-11 DIAGNOSIS — I739 Peripheral vascular disease, unspecified: Secondary | ICD-10-CM

## 2023-02-11 NOTE — Progress Notes (Signed)
Subjective:  Patient ID: Evan Nelson, male    DOB: 19-Feb-1961,  MRN: [id number]  Chief Complaint  Patient presents with   Wound Check    Pt stated that he is here for a wound check     DOS: 09/11/22 Procedure: Left fifth toe amputation  62 y.o. male returns for post surgical wound dehiscence. Relates doing well and pain improved. Scheduled for vascular surgery tomorrow for stent placement.   Review of Systems: Negative except as noted in the HPI. Denies N/V/F/Ch.  Past Medical History:  Diagnosis Date   Hypertension    Peripheral arterial disease (HCC)    Type 2 diabetes mellitus with other specified complication (HCC)     Current Outpatient Medications:    acetaminophen (TYLENOL) 500 MG tablet, Take 2 tablets (1,000 mg total) by mouth every 6 (six) hours as needed for moderate pain., Disp: 180 tablet, Rfl: 0   aspirin EC 81 MG tablet, Take 1 tablet (81 mg total) by mouth daily. Swallow whole., Disp: 30 tablet, Rfl: 12   atorvastatin (LIPITOR) 40 MG tablet, Take 1 tablet (40 mg total) by mouth at bedtime., Disp: 90 tablet, Rfl: 0   Blood Glucose Monitoring Suppl (ACCU-CHEK GUIDE ME) w/Device KIT, Use in the morning, at noon, and at bedtime., Disp: 1 kit, Rfl: 0   dapagliflozin propanediol (FARXIGA) 10 MG TABS tablet, Take 1 tablet (10 mg total) by mouth daily before breakfast., Disp: 30 tablet, Rfl: 11   docusate sodium (COLACE) 100 MG capsule, Take 1 capsule (100 mg total) by mouth 2 (two) times daily., Disp: 10 capsule, Rfl: 0   glipiZIDE (GLUCOTROL) 5 MG tablet, Take 1/2 tablet (2.5 mg total) by mouth 2 (two) times daily., Disp: 30 tablet, Rfl: 11   glucose blood (PIP BLOOD GLUCOSE TEST STRIP) test strip, Use as instructed, Disp: 100 each, Rfl: 12   metFORMIN (GLUCOPHAGE) 1000 MG tablet, Take 1 tablet (1,000 mg total) by mouth 2 (two) times daily with a meal., Disp: 180 tablet, Rfl: 3  Social History   Tobacco Use  Smoking Status Every Day   Types: Cigarettes   Smokeless Tobacco Never    No Known Allergies Objective:  There were no vitals filed for this visit. There is no height or weight on file to calculate BMI. Constitutional Well developed. Well nourished.  Vascular Foot warm and well perfused. Capillary refill normal to all digits.   Neurologic Normal speech. Oriented to person, place, and time. Epicritic sensation to light touch grossly present bilaterally.  Dermatologic Dehiscence noted to incision site with necrotic wound developing overlying and progressive to midfoot area. Does not appear to be worse from previous. There has been improvement since last visit.  No erythema edema or purulence noted. Measuring about 1.5 cm x 1 cm x 0.3 cm   Orthopedic: Tenderness to palpation noted about the surgical site.   Radiographs: Interval resection of left fifth ray.  Assessment:   1. 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 Peripheral vascular disease (HCC)   3. Type 2 diabetes mellitus with other specified complication, without long-term current use of insulin (HCC)         Plan:  Patient was evaluated and treated and all questions answered.  S/p foot surgery left -Progressing as expected post-operatively. -WB Status: WBAT in surgical shoe  Ulcer left foot with fat layer exposed  -Debridement as below. -Dressed with betadine, DSD. -Off-loading with surgical shoe. -No abx indicated.  -Discussed glucose control and  proper protein-rich diet.  -Discussed if any worsening redness, pain, fever or chills to call or may need to report to the emergency room. Patient expressed understanding.   Procedure: Excisional Debridement of Wound Rationale: Removal of non-viable soft tissue from the wound to promote healing.  Anesthesia: none Pre-Debridement Wound Measurements: overlying slough Post-Debridement Wound Measurements: 1.5 cm x 1 cm x 0.3 cm  Type of Debridement: Sharp Excisional Tissue Removed: Non-viable soft  tissue Depth of Debridement: subcutaneous tissue. Technique: Sharp excisional debridement to bleeding, viable wound base.  Dressing: Dry, sterile, compression dressing. Disposition: Patient tolerated procedure well. Patient to return in 2 week for follow-up.  Return in about 2 weeks (around 02/25/2023) for wound check.    Return in about 2 weeks (around 02/25/2023) for wound check.

## 2023-02-12 ENCOUNTER — Ambulatory Visit (HOSPITAL_COMMUNITY)
Admission: RE | Admit: 2023-02-12 | Discharge: 2023-02-12 | Disposition: A | Discharge status: Home / Self Care | Payer: Medicare PPO | Attending: Surgery | Admitting: Surgery

## 2023-02-12 ENCOUNTER — Other Ambulatory Visit: Payer: Self-pay

## 2023-02-12 ENCOUNTER — Encounter (HOSPITAL_COMMUNITY)
Admission: RE | Disposition: A | Discharge status: Home / Self Care | Payer: Self-pay | Source: Home / Self Care | Attending: Surgery

## 2023-02-12 DIAGNOSIS — I70245 Atherosclerosis of native arteries of left leg with ulceration of other part of foot: Secondary | ICD-10-CM | POA: Diagnosis not present

## 2023-02-12 DIAGNOSIS — E11621 Type 2 diabetes mellitus with foot ulcer: Secondary | ICD-10-CM | POA: Insufficient documentation

## 2023-02-12 DIAGNOSIS — L97529 Non-pressure chronic ulcer of other part of left foot with unspecified severity: Secondary | ICD-10-CM | POA: Diagnosis not present

## 2023-02-12 DIAGNOSIS — E1151 Type 2 diabetes mellitus with diabetic peripheral angiopathy without gangrene: Secondary | ICD-10-CM | POA: Insufficient documentation

## 2023-02-12 DIAGNOSIS — I7025 Atherosclerosis of native arteries of other extremities with ulceration: Secondary | ICD-10-CM

## 2023-02-12 DIAGNOSIS — F17210 Nicotine dependence, cigarettes, uncomplicated: Secondary | ICD-10-CM | POA: Insufficient documentation

## 2023-02-12 DIAGNOSIS — Z7984 Long term (current) use of oral hypoglycemic drugs: Secondary | ICD-10-CM | POA: Diagnosis not present

## 2023-02-12 HISTORY — PX: ABDOMINAL AORTOGRAM W/LOWER EXTREMITY: CATH118223

## 2023-02-12 HISTORY — PX: PERIPHERAL VASCULAR INTERVENTION: CATH118257

## 2023-02-12 HISTORY — PX: PERIPHERAL VASCULAR ATHERECTOMY: CATH118256

## 2023-02-12 HISTORY — PX: PERIPHERAL VASCULAR BALLOON ANGIOPLASTY: CATH118281

## 2023-02-12 LAB — POCT I-STAT, CHEM 8
BUN: 18 mg/dL (ref 8–23)
Calcium, Ion: 1.2 mmol/L (ref 1.15–1.40)
Chloride: 107 mmol/L (ref 98–111)
Creatinine, Ser: 1 mg/dL (ref 0.61–1.24)
Glucose, Bld: 106 mg/dL — ABNORMAL HIGH (ref 70–99)
HCT: 48 % (ref 39.0–52.0)
Hemoglobin: 16.3 g/dL (ref 13.0–17.0)
Potassium: 3.9 mmol/L (ref 3.5–5.1)
Sodium: 140 mmol/L (ref 135–145)
TCO2: 20 mmol/L — ABNORMAL LOW (ref 22–32)

## 2023-02-12 LAB — POCT ACTIVATED CLOTTING TIME: Activated Clotting Time: 244 seconds

## 2023-02-12 LAB — GLUCOSE, CAPILLARY
Glucose-Capillary: 105 mg/dL — ABNORMAL HIGH (ref 70–99)
Glucose-Capillary: 75 mg/dL (ref 70–99)

## 2023-02-12 SURGERY — ABDOMINAL AORTOGRAM W/LOWER EXTREMITY
Anesthesia: LOCAL

## 2023-02-12 MED ORDER — HEPARIN SODIUM (PORCINE) 1000 UNIT/ML IJ SOLN
INTRAMUSCULAR | Status: AC
  Filled 2023-02-12: qty 10

## 2023-02-12 MED ORDER — MIDAZOLAM HCL 2 MG/2ML IJ SOLN
INTRAMUSCULAR | Status: AC
  Filled 2023-02-12: qty 2

## 2023-02-12 MED ORDER — CLOPIDOGREL BISULFATE 75 MG PO TABS: 75.0000 mg | ORAL_TABLET | Freq: Every day | ORAL | 11 refills | Status: DC

## 2023-02-12 MED ORDER — CLOPIDOGREL BISULFATE 300 MG PO TABS
ORAL_TABLET | ORAL | Status: AC
  Filled 2023-02-12: qty 1

## 2023-02-12 MED ORDER — HEPARIN (PORCINE) IN NACL 1000-0.9 UT/500ML-% IV SOLN
INTRAVENOUS | Status: DC | PRN
  Administered 2023-02-12 (×2): 500 mL

## 2023-02-12 MED ORDER — HEPARIN SODIUM (PORCINE) 1000 UNIT/ML IJ SOLN
INTRAMUSCULAR | Status: DC | PRN
  Administered 2023-02-12: 3000 [IU] via INTRAVENOUS
  Administered 2023-02-12: 9000 [IU] via INTRAVENOUS

## 2023-02-12 MED ORDER — FENTANYL CITRATE (PF) 100 MCG/2ML IJ SOLN
INTRAMUSCULAR | Status: AC
  Filled 2023-02-12: qty 2

## 2023-02-12 MED ORDER — IODIXANOL 320 MG/ML IV SOLN
INTRAVENOUS | Status: DC | PRN
  Administered 2023-02-12: 190 mL

## 2023-02-12 MED ORDER — LIDOCAINE HCL (PF) 1 % IJ SOLN
INTRAMUSCULAR | Status: AC
  Filled 2023-02-12: qty 30

## 2023-02-12 MED ORDER — SODIUM CHLORIDE 0.9 % IV SOLN: INTRAVENOUS | Status: DC

## 2023-02-12 MED ORDER — LIDOCAINE HCL (PF) 1 % IJ SOLN
INTRAMUSCULAR | Status: DC | PRN
  Administered 2023-02-12: 10 mL

## 2023-02-12 MED ORDER — FENTANYL CITRATE (PF) 100 MCG/2ML IJ SOLN
INTRAMUSCULAR | Status: DC | PRN
  Administered 2023-02-12: 25 ug via INTRAVENOUS
  Administered 2023-02-12: 50 ug via INTRAVENOUS
  Administered 2023-02-12 (×2): 25 ug via INTRAVENOUS

## 2023-02-12 MED ORDER — MIDAZOLAM HCL 2 MG/2ML IJ SOLN
INTRAMUSCULAR | Status: DC | PRN
  Administered 2023-02-12 (×4): 1 mg via INTRAVENOUS

## 2023-02-12 SURGICAL SUPPLY — 25 items
BALLN COYOTE OTW 2.5X220X150 (BALLOONS) ×3
BALLOON COYOTE OTW 2.5X220X150 (BALLOONS) IMPLANT
CATH ANGIO 5F BER2 100CM (CATHETERS) IMPLANT
CATH AURYON ATHERECTOMY 0.9 (CATHETERS) IMPLANT
CATH INFINITI 5 FR IM (CATHETERS) IMPLANT
CATH NAVICROSS ANGLED 90CM (MICROCATHETER) IMPLANT
CATH OMNI FLUSH 5F 65CM (CATHETERS) IMPLANT
CATH RUBICON 014 150 (CATHETERS) IMPLANT
DEVICE VASC CLSR CELT ART 6 (Vascular Products) IMPLANT
KIT ENCORE 26 ADVANTAGE (KITS) IMPLANT
KIT MICROPUNCTURE NIT STIFF (SHEATH) IMPLANT
KIT PV (KITS) ×3 IMPLANT
SHEATH CATAPULT 6FR 60 (SHEATH) IMPLANT
SHEATH PINNACLE 5F 10CM (SHEATH) IMPLANT
SHEATH PINNACLE 6F 10CM (SHEATH) IMPLANT
SHEATH PROBE COVER 6X72 (BAG) IMPLANT
STENT EXPRESS LD 8X17X75 (Permanent Stent) IMPLANT
STOPCOCK MORSE 400PSI 3WAY (MISCELLANEOUS) IMPLANT
SYR MEDRAD MARK 7 150ML (SYRINGE) ×3 IMPLANT
TRANSDUCER W/STOPCOCK (MISCELLANEOUS) ×3 IMPLANT
TRAY PV CATH (CUSTOM PROCEDURE TRAY) ×3 IMPLANT
TUBING CIL FLEX 10 FLL-RA (TUBING) IMPLANT
WIRE APROACH 12G .014X300CM (WIRE) IMPLANT
WIRE BENTSON .035X145CM (WIRE) IMPLANT
WIRE SPARTACORE .014X300CM (WIRE) IMPLANT

## 2023-02-12 NOTE — Interval H&P Note (Signed)
History and Physical Interval Note:  02/12/2023 7:38 AM  Evan Nelson  has presented today for surgery, with the diagnosis of Peripheral artery disease with left foot ulcer.  The various methods of treatment have been discussed with the patient and family. After consideration of risks, benefits and other options for treatment, the patient has consented to  Procedure(s): ABDOMINAL AORTOGRAM W/LOWER EXTREMITY (N/A) as a surgical intervention.  The patient's history has been reviewed, patient examined, no change in status, stable for surgery.  I have reviewed the patient's chart and labs.  Questions were answered to the patient's satisfaction.     Durene Cal

## 2023-02-12 NOTE — Progress Notes (Signed)
Pt ambulated to and from bathroom to void with no signs of oozing from right groin site  

## 2023-02-12 NOTE — Op Note (Signed)
Patient name: Evan Nelson MRN: [id number] DOB: 04/08/1961 Sex: male  02/12/2023 Pre-operative Diagnosis: Left toe ulcer Post-operative diagnosis:  Same Surgeon:  Durene Cal Procedure Performed:  1.  Ultrasound-guided access, right femoral artery  2.  Aortobifemoral angiogram  3.  Left leg angiogram  4.  Selective injection from left superficial femoral artery and the left posterior tibial artery  5.  Laser atherectomy with angioplasty, left posterior tibial artery  6.  Stent, left common iliac artery  7.  Conscious sedation, 137 minutes  8.  Closure device, Celt   Indications: This is a 62 year old gentleman who has previously undergone angiography in February 2024 for a left toe wound.  He had stenting of his left superficial femoral artery and a left great toe amputation.  He is still having issues with wound healing.  He comes in today for further evaluation  Procedure:  The patient was identified in the holding area and taken to room 8.  The patient was then placed supine on the table and prepped and draped in the usual sterile fashion.  A time out was called.  Conscious sedation was administered with the use of IV fentanyl and Versed under continuous physician and nurse monitoring.  Heart rate, blood pressure, and oxygen saturation were continuously monitored.  Total sedation time was 137 minutes.  Ultrasound was used to evaluate the right common femoral artery.  It was patent .  A digital ultrasound image was acquired.  A micropuncture needle was used to access the right common femoral artery under ultrasound guidance.  An 018 wire was advanced without resistance and a micropuncture sheath was placed.  The 018 wire was removed and a benson wire was placed.  The micropuncture sheath was exchanged for a 5 french sheath.  An omniflush catheter was advanced over the wire to the level of L-1.  An abdominal angiogram was obtained.  Next, the catheter was pulled down to the aortic  bifurcation and pelvic angiography was performed.  Next, using the omniflush catheter and a benson wire, followed by a Marshia Ly cross catheter the aortic bifurcation was crossed and the catheter was placed into theleft external iliac artery and left runoff was obtained.    Findings:   Aortogram: No significant renal artery stenosis.  The infrarenal abdominal aorta is widely patent.  The right common iliac and external iliac artery are widely patent.  The right common femoral artery is widely patent.  There is a stenosis in the distal left common iliac artery with a 25 mm gradient.  The extrailiac artery and left common femoral artery are widely patent.  Right Lower Extremity: Not evaluated  Left Lower Extremity: The left common femoral profundofemoral arteries are widely patent.  The superficial femoral and its associated stents are widely patent.  The popliteal artery is widely patent.  The dominant runoff to the foot is the anterior tibial artery.  The posterior tibial artery has multiple lesions of greater than 80%.  Intervention: After the above images were acquired the decision was made to proceed with intervention.  A 6 French 65 cm sheath was placed into the left superficial femoral artery.  The patient was fully heparinized.  I then tried to gain access into the posterior tibial artery.  Multiple injections through the superficial femoral artery were performed to help with exposure.  I had a significant leg difficult time getting into the posterior tibial artery because of the ankle.  I used a Rubicon catheter, a Berenstein 2, and  ultimately a IM catheter was able to select the posterior tibial artery.  Once I got a wire down to the ankle, I performed laser atherectomy using a 0.9 mm laser catheter throughout the total length of the posterior tibial artery.  This was followed up with a balloon angioplasty using a 2.5 mm balloon.  Completion imaging showed spasm within the artery but no significant  stenosis with the exception of the ostium for which I repeated balloon angioplasty.  Completion imaging showed a patent posterior tibial artery with no stenosis.  I then turned my attention towards the left common iliac stenosis with 20 to 25 mm gradient.  This was primarily stented using a 8 x 17 Omnilink balloon expandable stent with no residual stenosis.  The groin was closed with a Celt  Impression:  #1 25 mm gradient across the left common iliac stenosis.  This was successfully treated using an 8 mm balloon expandable stent  #2 diffuse stenosis within the left posterior tibial artery treated using a laser a 2.5 mm balloon  #3 diffuse disease out onto the foot    V. Durene Cal, M.D., [facility] [place] Office: [phone] Pager:  [phone]

## 2023-02-13 ENCOUNTER — Encounter (HOSPITAL_COMMUNITY): Payer: Self-pay | Admitting: Surgery

## 2023-02-18 ENCOUNTER — Telehealth: Payer: Self-pay

## 2023-02-18 NOTE — Telephone Encounter (Signed)
Pt left VM on triage line-did not state his question/concerns. Returned his call 2x-unable to LVM.

## 2023-02-19 NOTE — Telephone Encounter (Signed)
Pt called asking when to take dressing off groin.  Reviewed pt's chart, returned call for clarification, two identifiers used. Reviewed d/c instructions with pt regarding incision care. Confirmed understanding.

## 2023-02-21 ENCOUNTER — Telehealth: Payer: Self-pay

## 2023-02-21 NOTE — Telephone Encounter (Addendum)
Per [facility], Angiogram performed on 02/12/23 is recommended for a partial denial of intervention codes CPT 37221 and 16109.  CPT [phone] is approved.  Per Rep Lawanda Cousins., Reasons for recommended denial codes are:  CPT [phone] (Iliac Angioplasty with stent) "Per Op Report, distal CIA with 'There is a stenosis in the distal left common iliac artery with a 25mm gradient', however, Op Report does not specify a percentage".  CPT [phone] (Tibial/Peroneal Angioplasty with Atherectomy) "Does not meet for approval, per limitations-Endovascular revascularization interventions will NOT be considered medically reasonable and necessary for Atherectomy for the treatment of PAD in the aorto-iliac or infrapopliteal arteries".   A Peer to Peer Review is being set up for [employer]'s Only available EST time slot on 02/26/23 at 10:20am with [employer] Dr. Larena Sox.

## 2023-02-25 ENCOUNTER — Encounter: Payer: Self-pay | Admitting: Podiatry

## 2023-02-25 ENCOUNTER — Ambulatory Visit (INDEPENDENT_AMBULATORY_CARE_PROVIDER_SITE_OTHER): Payer: Medicare PPO | Admitting: Podiatry

## 2023-02-25 DIAGNOSIS — E1169 Type 2 diabetes mellitus with other specified complication: Secondary | ICD-10-CM

## 2023-02-25 DIAGNOSIS — I739 Peripheral vascular disease, unspecified: Secondary | ICD-10-CM

## 2023-02-25 DIAGNOSIS — L97522 Non-pressure chronic ulcer of other part of left foot with fat layer exposed: Secondary | ICD-10-CM

## 2023-02-25 NOTE — Progress Notes (Signed)
Subjective:  Patient ID: Evan Nelson, male    DOB: [birth year],  MRN: [id number]  No chief complaint on file.   DOS: 09/11/22 Procedure: Left fifth toe amputation  62 y.o. male returns for post surgical wound dehiscence. Relates doing well and pain improved. Vascular procedure went well.   Review of Systems: Negative except as noted in the HPI. Denies N/V/F/Ch.  Past Medical History:  Diagnosis Date   Hypertension    Peripheral arterial disease (HCC)    Type 2 diabetes mellitus with other specified complication (HCC)     Current Outpatient Medications:    acetaminophen (TYLENOL) 500 MG tablet, Take 2 tablets (1,000 mg total) by mouth every 6 (six) hours as needed for moderate pain., Disp: 180 tablet, Rfl: 0   aspirin EC 81 MG tablet, Take 1 tablet (81 mg total) by mouth daily. Swallow whole., Disp: 30 tablet, Rfl: 12   atorvastatin (LIPITOR) 40 MG tablet, Take 1 tablet (40 mg total) by mouth at bedtime., Disp: 90 tablet, Rfl: 0   Blood Glucose Monitoring Suppl (ACCU-CHEK GUIDE ME) w/Device KIT, Use in the morning, at noon, and at bedtime., Disp: 1 kit, Rfl: 0   clopidogrel (PLAVIX) 75 MG tablet, Take 75 mg by mouth daily., Disp: , Rfl:    clopidogrel (PLAVIX) 75 MG tablet, Take 1 tablet (75 mg total) by mouth daily., Disp: 30 tablet, Rfl: 11   dapagliflozin propanediol (FARXIGA) 10 MG TABS tablet, Take 1 tablet (10 mg total) by mouth daily before breakfast., Disp: 30 tablet, Rfl: 11   docusate sodium (COLACE) 100 MG capsule, Take 1 capsule (100 mg total) by mouth 2 (two) times daily., Disp: 10 capsule, Rfl: 0   glipiZIDE (GLUCOTROL) 5 MG tablet, Take 1/2 tablet (2.5 mg total) by mouth 2 (two) times daily., Disp: 30 tablet, Rfl: 11   glucose blood (PIP BLOOD GLUCOSE TEST STRIP) test strip, Use as instructed, Disp: 100 each, Rfl: 12   metFORMIN (GLUCOPHAGE) 1000 MG tablet, Take 1 tablet (1,000 mg total) by mouth 2 (two) times daily with a meal., Disp: 180 tablet, Rfl: 3  Social  History   Tobacco Use  Smoking Status Every Day   Types: Cigarettes  Smokeless Tobacco Never    No Known Allergies Objective:  There were no vitals filed for this visit. There is no height or weight on file to calculate BMI. Constitutional Well developed. Well nourished.  Vascular Foot warm and well perfused. Capillary refill normal to all digits.   Neurologic Normal speech. Oriented to person, place, and time. Epicritic sensation to light touch grossly present bilaterally.  Dermatologic Dehiscence noted to incision site with necrotic wound developing overlying and progressive to midfoot area. Does not appear to be worse from previous. There has been improvement since last visit.  No erythema edema or purulence noted. Measuring about 1.5 cm x 0.5 cm x 0.3 cm   Orthopedic: Tenderness to palpation noted about the surgical site.   Radiographs: Interval resection of left fifth ray.  Assessment:   1. 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 Peripheral vascular disease (HCC)   3. Type 2 diabetes mellitus with other specified complication, without long-term current use of insulin (HCC)          Plan:  Patient was evaluated and treated and all questions answered.  S/p foot surgery left -Progressing as expected post-operatively. -WB Status: WBAT in surgical shoe  Ulcer left foot with fat layer exposed  -Debridement as below. -Dressed  with betadine, DSD. -Off-loading with surgical shoe. -No abx indicated.  -Discussed glucose control and proper protein-rich diet.  -Discussed if any worsening redness, pain, fever or chills to call or may need to report to the emergency room. Patient expressed understanding.   Procedure: Excisional Debridement of Wound Rationale: Removal of non-viable soft tissue from the wound to promote healing.  Anesthesia: none Pre-Debridement Wound Measurements: overlying slough Post-Debridement Wound Measurements: 1.5 cm x 0.5 cm x 0.3 cm   Type of Debridement: Sharp Excisional Tissue Removed: Non-viable soft tissue Depth of Debridement: subcutaneous tissue. Technique: Sharp excisional debridement to bleeding, viable wound base.  Dressing: Dry, sterile, compression dressing. Disposition: Patient tolerated procedure well. Patient to return in 2 week for follow-up.  Return in about 2 weeks (around 03/11/2023) for wound check.    Return in about 2 weeks (around 03/11/2023) for wound check.

## 2023-03-08 ENCOUNTER — Ambulatory Visit: Payer: Medicare PPO | Admitting: Family Medicine

## 2023-03-11 ENCOUNTER — Encounter: Payer: Self-pay | Admitting: Podiatry

## 2023-03-11 ENCOUNTER — Ambulatory Visit (INDEPENDENT_AMBULATORY_CARE_PROVIDER_SITE_OTHER): Payer: Medicare PPO | Admitting: Podiatry

## 2023-03-11 DIAGNOSIS — L97522 Non-pressure chronic ulcer of other part of left foot with fat layer exposed: Secondary | ICD-10-CM

## 2023-03-11 DIAGNOSIS — E1169 Type 2 diabetes mellitus with other specified complication: Secondary | ICD-10-CM | POA: Diagnosis not present

## 2023-03-11 DIAGNOSIS — I739 Peripheral vascular disease, unspecified: Secondary | ICD-10-CM | POA: Diagnosis not present

## 2023-03-11 NOTE — Progress Notes (Signed)
Subjective:  Patient ID: Evan Nelson, male    DOB: [birth year],  MRN: [id number]  Chief Complaint  Patient presents with   Wound Check    NO PAIN, DENIES N/V/F/C/SOB, NO DRAINAGE, NO ODOR    DOS: 09/11/22 Procedure: Left fifth toe amputation  62 y.o. male returns for post surgical wound dehiscence. Relates doing well and pain improved. Vascular procedure went well.   Review of Systems: Negative except as noted in the HPI. Denies N/V/F/Ch.  Past Medical History:  Diagnosis Date   Hypertension    Peripheral arterial disease (HCC)    Type 2 diabetes mellitus with other specified complication (HCC)     Current Outpatient Medications:    aspirin EC 81 MG tablet, Take 1 tablet (81 mg total) by mouth daily. Swallow whole., Disp: 30 tablet, Rfl: 12   atorvastatin (LIPITOR) 40 MG tablet, Take 1 tablet (40 mg total) by mouth at bedtime., Disp: 90 tablet, Rfl: 0   Blood Glucose Monitoring Suppl (ACCU-CHEK GUIDE ME) w/Device KIT, Use in the morning, at noon, and at bedtime., Disp: 1 kit, Rfl: 0   clopidogrel (PLAVIX) 75 MG tablet, Take 75 mg by mouth daily., Disp: , Rfl:    clopidogrel (PLAVIX) 75 MG tablet, Take 1 tablet (75 mg total) by mouth daily., Disp: 30 tablet, Rfl: 11   dapagliflozin propanediol (FARXIGA) 10 MG TABS tablet, Take 1 tablet (10 mg total) by mouth daily before breakfast., Disp: 30 tablet, Rfl: 11   docusate sodium (COLACE) 100 MG capsule, Take 1 capsule (100 mg total) by mouth 2 (two) times daily., Disp: 10 capsule, Rfl: 0   glipiZIDE (GLUCOTROL) 5 MG tablet, Take 1/2 tablet (2.5 mg total) by mouth 2 (two) times daily., Disp: 30 tablet, Rfl: 11   glucose blood (PIP BLOOD GLUCOSE TEST STRIP) test strip, Use as instructed, Disp: 100 each, Rfl: 12   metFORMIN (GLUCOPHAGE) 1000 MG tablet, Take 1 tablet (1,000 mg total) by mouth 2 (two) times daily with a meal., Disp: 180 tablet, Rfl: 3  Social History   Tobacco Use  Smoking Status Every Day   Types: Cigarettes   Smokeless Tobacco Never    No Known Allergies Objective:  There were no vitals filed for this visit. There is no height or weight on file to calculate BMI. Constitutional Well developed. Well nourished.  Vascular Foot warm and well perfused. Capillary refill normal to all digits.   Neurologic Normal speech. Oriented to person, place, and time. Epicritic sensation to light touch grossly present bilaterally.  Dermatologic Dehiscence noted to incision site with necrotic wound developing overlying and progressive to midfoot area. Does not appear to be worse from previous. There has been improvement since last visit.  No erythema edema or purulence noted. Measuring about 1 cm x 0.2 cm x 0.2 cm   Orthopedic: Tenderness to palpation noted about the surgical site.   Radiographs: Interval resection of left fifth ray.  Assessment:   1. 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 Peripheral vascular disease (HCC)   3. Type 2 diabetes mellitus with other specified complication, without long-term current use of insulin (HCC)           Plan:  Patient was evaluated and treated and all questions answered.  S/p foot surgery left -Progressing as expected post-operatively. -WB Status: WBAT in surgical shoe  Ulcer left foot with fat layer exposed  -Debridement as below. -Dressed with betadine, DSD. -Off-loading with surgical shoe. -No abx indicated.  -  Discussed glucose control and proper protein-rich diet.  -Discussed if any worsening redness, pain, fever or chills to call or may need to report to the emergency room. Patient expressed understanding.   Procedure: Excisional Debridement of Wound Rationale: Removal of non-viable soft tissue from the wound to promote healing.  Anesthesia: none Pre-Debridement Wound Measurements: overlying slough Post-Debridement Wound Measurements: 1 cm x 0.2 cm x 0.2 cm  Type of Debridement: Sharp Excisional Tissue Removed: Non-viable soft  tissue Depth of Debridement: subcutaneous tissue. Technique: Sharp excisional debridement to bleeding, viable wound base.  Dressing: Dry, sterile, compression dressing. Disposition: Patient tolerated procedure well. Patient to return in 2 week for follow-up.  Return in about 2 weeks (around 03/25/2023) for wound check.    Return in about 2 weeks (around 03/25/2023) for wound check.

## 2023-03-12 ENCOUNTER — Encounter: Payer: Self-pay | Admitting: Family Medicine

## 2023-03-12 ENCOUNTER — Ambulatory Visit (INDEPENDENT_AMBULATORY_CARE_PROVIDER_SITE_OTHER): Payer: Medicare PPO | Admitting: Family Medicine

## 2023-03-12 VITALS — BP 135/72 | HR 103 | Temp 98.3°F | Ht 67.0 in | Wt 196.0 lb

## 2023-03-12 DIAGNOSIS — E1169 Type 2 diabetes mellitus with other specified complication: Secondary | ICD-10-CM

## 2023-03-12 DIAGNOSIS — I739 Peripheral vascular disease, unspecified: Secondary | ICD-10-CM | POA: Diagnosis not present

## 2023-03-12 DIAGNOSIS — E785 Hyperlipidemia, unspecified: Secondary | ICD-10-CM

## 2023-03-12 DIAGNOSIS — R809 Proteinuria, unspecified: Secondary | ICD-10-CM

## 2023-03-12 DIAGNOSIS — R030 Elevated blood-pressure reading, without diagnosis of hypertension: Secondary | ICD-10-CM

## 2023-03-12 LAB — BAYER DCA HB A1C WAIVED: HB A1C (BAYER DCA - WAIVED): 6.6 % — ABNORMAL HIGH (ref 4.8–5.6)

## 2023-03-12 NOTE — Progress Notes (Signed)
Acute Office Visit  Subjective:  Patient ID: Evan Nelson, male    DOB: Mar 23, 1961, 62 y.o.   MRN: [id number]  Chief Complaint  Patient presents with   Medical Management of Chronic Issues   HPI Patient is in today for follow up of chronic conditions.  Type 2 Diabetes with HLD and wound  Glucometer: Accucheck  Does not need refills at this time High at home: 112; Low at home: 36, Taking medication(s): metformin, glipizide, farxiga ($300)   Last eye exam: due  Last foot exam: due - completed today  Last A1c:  Lab Results  Component Value Date   HGBA1C 8.6 (H) 09/09/2022   Nephropathy screen indicated?: due  Last flu, zoster and/or pneumovax:  There is no immunization history on file for this patient.  ROS: Denies dizziness, LOC, polyuria, polydipsia, unintended weight loss/gain, numbness or tingling in extremities, shortness of breath or chest pain. Endorses foot wound   Hyperlipidemia: Patient presents with hyperlipidemia.  negative. There is not a family history of hyperlipidemia. There is not a family history of early ischemia heart disease.  Elevated BP  Reports that his niece takes his BP at home with a wrist cuff and that it is "always good" does not have log or remember numbers.   Non fasting  Follows with Vascular and Podiatry for osteomyelitis  Reports that he had a good visit with his grandson.   ROS As per HPI  Objective:  BP 135/72   Pulse (!) 103   Temp 98.3 F (36.8 C)   Ht 5\' 7"  (1.702 m)   Wt 196 lb (88.9 kg)   SpO2 99%   BMI 30.70 kg/m   Physical Exam Constitutional:      General: He is awake. He is not in acute distress.    Appearance: Normal appearance. He is well-developed and well-groomed. He is not ill-appearing, toxic-appearing or diaphoretic.  Cardiovascular:     Rate and Rhythm: Normal rate.     Pulses: Normal pulses.          Radial pulses are 2+ on the right side and 2+ on the left side.       Posterior tibial pulses are 2+ on  the right side and 2+ on the left side.     Heart sounds: Normal heart sounds. No murmur heard.    No gallop.  Pulmonary:     Effort: Pulmonary effort is normal. No respiratory distress.     Breath sounds: Normal breath sounds. No stridor. No wheezing, rhonchi or rales.  Musculoskeletal:     Cervical back: Full passive range of motion without pain and neck supple.     Right lower leg: No edema.     Left lower leg: No edema.     Left foot: Decreased range of motion.  Feet:     Right foot:     Protective Sensation: 10 sites tested.  10 sites sensed.     Skin integrity: Skin breakdown, callus, dry skin and fissure present.     Toenail Condition: Right toenails are abnormally thick and long. Fungal disease present.    Left foot:     Protective Sensation: 7 sites tested.  7 sites sensed.     Skin integrity: Ulcer, skin breakdown, callus, dry skin and fissure present.     Toenail Condition: Left toenails are abnormally thick and long. Fungal disease present. Skin:    General: Skin is warm.     Capillary Refill: Capillary refill takes less  than 2 seconds.  Neurological:     General: No focal deficit present.     Mental Status: He is alert, oriented to person, place, and time and easily aroused. Mental status is at baseline.     GCS: GCS eye subscore is 4. GCS verbal subscore is 5. GCS motor subscore is 6.     Motor: No weakness.  Psychiatric:        Attention and Perception: Attention and perception normal.        Mood and Affect: Mood and affect normal.        Speech: Speech normal.        Behavior: Behavior normal. Behavior is cooperative.        Thought Content: Thought content normal. Thought content does not include homicidal or suicidal ideation. Thought content does not include homicidal or suicidal plan.        Cognition and Memory: Cognition and memory normal.        Judgment: Judgment normal.       01/24/2023    9:22 AM 12/06/2022   10:40 AM  Depression screen PHQ 2/9   Decreased Interest 0 0  Down, Depressed, Hopeless 0 0  PHQ - 2 Score 0 0  Altered sleeping 0 0  Tired, decreased energy 0 0  Change in appetite 0 0  Feeling bad or failure about yourself  0 0  Trouble concentrating 0 0  Moving slowly or fidgety/restless 0 0  Suicidal thoughts 0 0  PHQ-9 Score 0 0  Difficult doing work/chores Not difficult at all Not difficult at all      12/06/2022   10:40 AM  GAD 7 : Generalized Anxiety Score  Nervous, Anxious, on Edge 0  Control/stop worrying 0  Worry too much - different things 0  Trouble relaxing 0  Restless 0  Easily annoyed or irritable 0  Afraid - awful might happen 0  Total GAD 7 Score 0  Anxiety Difficulty Not difficult at all   The 10-year ASCVD risk score (Arnett DK, et al., 2019) is: 32.2%   Values used to calculate the score:     Age: 89 years     Sex: Male     Is Non-Hispanic African American: Yes     Diabetic: Yes     Tobacco smoker: Yes     Systolic Blood Pressure: 135 mmHg     Is BP treated: No     HDL Cholesterol: 28 mg/dL     Total Cholesterol: 155 mg/dL  Assessment & Plan:  1. Type 2 diabetes mellitus with other specified complication, without long-term current use of insulin (HCC) Reviewed A1C.  Well controlled.  Continue current regimen.  Reached out to pharmacist for alternatives for cost of farxiga  - Bayer DCA Hb A1c Waived; Future - Microalbumin / creatinine urine ratio - Bayer DCA Hb A1c Waived  2. Hyperlipidemia associated with type 2 diabetes mellitus (HCC) Orders previously placed for patient to repeat Lipid panel Not fasting today. States that he will complete labs in 1-2 weeks while fasting. Very high ASCVD score. Patient is following with vascular. Will discuss with patient increasing dose to 80mg  for optimization of prevention.   3. Elevated blood pressure reading Elevated BP today in office. Discussed with patient monitoring BP at home. Provided BP log to patient in clinic today. Instructed pt to  take BP first thing in the morning after sitting for 5 minutes with feet flat on the floor, arm at heart level. Discussed  with patient options for BP cuff at [facility], [employer], [facility] & [facility]. Pt verbalized they were able to obtain BP cuff. Will review measurements with patient at follow up and determine plan for BP management.   4. Peripheral arterial disease (HCC) Patient to follow up with vascular.   The above assessment and management plan was discussed with the patient. The patient verbalized understanding of and has agreed to the management plan using shared-decision making. Patient is aware to call the clinic if they develop any new symptoms or if symptoms fail to improve or worsen. Patient is aware when to return to the clinic for a follow-up visit. Patient educated on when it is appropriate to go to the emergency department.   Return in about 3 months (around 06/12/2023) for Chronic Condition Follow up.  Neale Burly, DNP-FNP [facility] [address] [place], [place] [address] [phone]

## 2023-03-12 NOTE — Patient Instructions (Signed)
Please bring BP log to next appt! Monitoring your BP at home   Your BP was elevated today in office  Please keep a log of your BP at home.  The best time to take BP is 1st thing in the morning after waking.  Sit for 5 minutes with feet flat on the floor, arm at heart level.  Options for BP cuffs are at [employer], [employer], [facility], [facility] & [facility].  We will review measurements at follow up and determine plan for BP management.  If you have access, you can send a message in MyChart with your measurements prior to your follow up appointment.  The brand I recommend to get is Omron (Bronze).

## 2023-03-14 MED ORDER — LOSARTAN POTASSIUM 25 MG PO TABS: 25.0000 mg | ORAL_TABLET | Freq: Every day | ORAL | 0 refills | Status: DC

## 2023-03-14 NOTE — Addendum Note (Signed)
Addended by: Neale Burly on: 03/14/2023 09:21 AM   Modules accepted: Orders

## 2023-03-14 NOTE — Progress Notes (Signed)
Will start low dose ARB for additional renal protection. Patient has not received renal artery [place]. Will order that as well.

## 2023-03-21 ENCOUNTER — Ambulatory Visit (INDEPENDENT_AMBULATORY_CARE_PROVIDER_SITE_OTHER): Payer: Medicare PPO | Admitting: Pharmacist

## 2023-03-21 DIAGNOSIS — Z7984 Long term (current) use of oral hypoglycemic drugs: Secondary | ICD-10-CM

## 2023-03-21 DIAGNOSIS — E1169 Type 2 diabetes mellitus with other specified complication: Secondary | ICD-10-CM

## 2023-03-21 DIAGNOSIS — E119 Type 2 diabetes mellitus without complications: Secondary | ICD-10-CM

## 2023-03-22 MED ORDER — DAPAGLIFLOZIN PROPANEDIOL 10 MG PO TABS
10.0000 mg | ORAL_TABLET | Freq: Every day | ORAL | 11 refills | Status: DC
Start: 2023-03-22 — End: 2023-08-13

## 2023-03-22 NOTE — Progress Notes (Signed)
03/21/2023 Name: Evan Nelson MRN: [id number] DOB: 06/23/1961  Chief Complaint  Patient presents with   Diabetes    Subjective: Patient with T2DM reports high copays with Marcelline Deist and appears to be in the Medicare coverage gap.  We will prepare and send medication assistance documents to the [facility].  Patient is stable on current regimen.   Medication Access/Adherence  Current Pharmacy:  [facility] - [place], [place] - [address] [place] [address] Phone: [phone] Fax: [phone]  [facility] [address] [place] [address] Phone: [phone] Fax: [phone]   Patient reports affordability concerns with their medications: Yes  Patient reports access/transportation concerns to their pharmacy: No  Patient reports adherence concerns with their medications:  No     Diabetes:  Current medications:  Jardiance -->farxiga, glipizide, metformin Medications tried in the past: n/a  Current glucose readings: n/a Using accucheck guide me meter  Current physical activity: encouraged  Current medication access support: needs patient assistance--will send to [facility] dept   Objective:  Lab Results  Component Value Date   HGBA1C 6.6 (H) 03/12/2023    Lab Results  Component Value Date   CREATININE 1.00 02/12/2023   BUN 18 02/12/2023   NA 140 02/12/2023   K 3.9 02/12/2023   CL 107 02/12/2023   CO2 23 11/08/2022    Lab Results  Component Value Date   CHOL 155 09/11/2022   HDL 28 (L) 09/11/2022   LDLCALC 112 (H) 09/11/2022   TRIG 76 09/11/2022   CHOLHDL 5.5 09/11/2022    Medications Reviewed Today     Reviewed by Danella Maiers, [facility] (Pharmacist) on 03/22/23 at 0920  Med List Status: <None>   Medication Order Taking? Sig Documenting Provider Last Dose Status Informant  aspirin EC 81 MG tablet [id number] No Take 1 tablet (81 mg total) by mouth daily. Swallow whole.  Arrie Senate, FNP Taking Active   atorvastatin (LIPITOR) 40 MG tablet [id number] No Take 1 tablet (40 mg total) by mouth at bedtime. Arrie Senate, [place] 02/11/2023 Expired 03/06/23 2359   Blood Glucose Monitoring Suppl (ACCU-CHEK GUIDE ME) w/Device KIT [id number] No Use in the morning, at noon, and at bedtime. Dorcas Carrow, MD Taking Active   clopidogrel (PLAVIX) 75 MG tablet [id number] No Take 75 mg by mouth daily. [provider] Taking Active   clopidogrel (PLAVIX) 75 MG tablet [id number] No Take 1 tablet (75 mg total) by mouth daily. Nada Libman, MD Taking Active   dapagliflozin propanediol (FARXIGA) 10 MG TABS tablet [id number] No Take 1 tablet (10 mg total) by mouth daily before breakfast. Milian, Aleen Campi, FNP Taking Active   docusate sodium (COLACE) 100 MG capsule [id number] No Take 1 capsule (100 mg total) by mouth 2 (two) times daily. Dorcas Carrow, MD Taking Active   glipiZIDE (GLUCOTROL) 5 MG tablet [id number] No Take 1/2 tablet (2.5 mg total) by mouth 2 (two) times daily. Gareth Eagle, PA-C Taking Active   glucose blood (PIP BLOOD GLUCOSE TEST STRIP) test strip [id number] No Use as instructed Milian, Aleen Campi, FNP Taking Active   losartan (COZAAR) 25 MG tablet [id number]  Take 1 tablet (25 mg total) by mouth daily. Arrie Senate, FNP  Active   metFORMIN (GLUCOPHAGE) 1000 MG tablet [id number] No Take 1 tablet (1,000 mg total) by mouth 2 (two) times daily with a meal. Milian, Aleen Campi, FNP Taking Active  Assessment/Plan:   Diabetes: - Currently controlled - Reviewed long term cardiovascular and renal outcomes of uncontrolled blood sugar - Reviewed goal A1c, goal fasting, and goal 2 hour post prandial glucose - Recommend to continue current regimen - Recommend to check glucose daily (fasting) or if symptomatic - Meets financial criteria for farxiga patient assistance program through [employer] P[employer]--will send to  [employer].  - one month free farxiga coupon mailed to patient   Follow Up Plan: patient to follow with health dept for assistance   Kieth Brightly, PharmD, BCACP Clinical Pharmacist, [facility]

## 2023-03-26 ENCOUNTER — Other Ambulatory Visit: Payer: Self-pay | Admitting: *Deleted

## 2023-03-26 DIAGNOSIS — I7025 Atherosclerosis of native arteries of other extremities with ulceration: Secondary | ICD-10-CM

## 2023-03-26 DIAGNOSIS — I739 Peripheral vascular disease, unspecified: Secondary | ICD-10-CM

## 2023-03-27 ENCOUNTER — Ambulatory Visit: Payer: Medicare PPO | Admitting: Podiatry

## 2023-03-27 ENCOUNTER — Encounter: Payer: Self-pay | Admitting: Podiatry

## 2023-03-27 DIAGNOSIS — I739 Peripheral vascular disease, unspecified: Secondary | ICD-10-CM

## 2023-03-27 DIAGNOSIS — E1169 Type 2 diabetes mellitus with other specified complication: Secondary | ICD-10-CM

## 2023-03-27 DIAGNOSIS — L97522 Non-pressure chronic ulcer of other part of left foot with fat layer exposed: Secondary | ICD-10-CM

## 2023-03-27 NOTE — Progress Notes (Signed)
Subjective:  Patient ID: Evan Nelson, male    DOB: Aug 30, 1960,  MRN: [id number]  Chief Complaint  Patient presents with   Wound Check    Pt present today for pt denies pain, N/V/F/C/SOB, NO DRAINAGE, NO ODOR    DOS: 09/11/22 Procedure: Left fifth toe amputation  62 y.o. male returns for post surgical wound dehiscence. Relates doing well and pain improved.   Review of Systems: Negative except as noted in the HPI. Denies N/V/F/Ch.  Past Medical History:  Diagnosis Date   Hypertension    Peripheral arterial disease (HCC)    Type 2 diabetes mellitus with other specified complication (HCC)     Current Outpatient Medications:    aspirin EC 81 MG tablet, Take 1 tablet (81 mg total) by mouth daily. Swallow whole., Disp: 30 tablet, Rfl: 12   atorvastatin (LIPITOR) 40 MG tablet, Take 1 tablet (40 mg total) by mouth at bedtime., Disp: 90 tablet, Rfl: 0   Blood Glucose Monitoring Suppl (ACCU-CHEK GUIDE ME) w/Device KIT, Use in the morning, at noon, and at bedtime., Disp: 1 kit, Rfl: 0   clopidogrel (PLAVIX) 75 MG tablet, Take 75 mg by mouth daily., Disp: , Rfl:    clopidogrel (PLAVIX) 75 MG tablet, Take 1 tablet (75 mg total) by mouth daily., Disp: 30 tablet, Rfl: 11   dapagliflozin propanediol (FARXIGA) 10 MG TABS tablet, Take 1 tablet (10 mg total) by mouth daily before breakfast., Disp: 30 tablet, Rfl: 11   docusate sodium (COLACE) 100 MG capsule, Take 1 capsule (100 mg total) by mouth 2 (two) times daily., Disp: 10 capsule, Rfl: 0   glipiZIDE (GLUCOTROL) 5 MG tablet, Take 1/2 tablet (2.5 mg total) by mouth 2 (two) times daily., Disp: 30 tablet, Rfl: 11   glucose blood (PIP BLOOD GLUCOSE TEST STRIP) test strip, Use as instructed, Disp: 100 each, Rfl: 12   losartan (COZAAR) 25 MG tablet, Take 1 tablet (25 mg total) by mouth daily., Disp: 90 tablet, Rfl: 0   metFORMIN (GLUCOPHAGE) 1000 MG tablet, Take 1 tablet (1,000 mg total) by mouth 2 (two) times daily with a meal., Disp: 180 tablet,  Rfl: 3  Social History   Tobacco Use  Smoking Status Every Day   Types: Cigarettes  Smokeless Tobacco Never    No Known Allergies Objective:  There were no vitals filed for this visit. There is no height or weight on file to calculate BMI. Constitutional Well developed. Well nourished.  Vascular Foot warm and well perfused. Capillary refill normal to all digits.   Neurologic Normal speech. Oriented to person, place, and time. Epicritic sensation to light touch grossly present bilaterally.  Dermatologic Dehiscence noted to incision site with necrotic wound developing overlying and progressive to midfoot area. Does not appear to be worse from previous. There has been improvement since last visit.  No erythema edema or purulence noted. Measuring about 0.8 cm x 0.2 cm x 0.2 cm   Orthopedic: Tenderness to palpation noted about the surgical site.   Radiographs: Interval resection of left fifth ray.  Assessment:   1. 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 Peripheral vascular disease (HCC)   3. Type 2 diabetes mellitus with other specified complication, without long-term current use of insulin (HCC)            Plan:  Patient was evaluated and treated and all questions answered.  S/p foot surgery left -Progressing as expected post-operatively. -WB Status: WBAT in surgical shoe  Ulcer  left foot with fat layer exposed  -Debridement as below. -Dressed with betadine, DSD. -Off-loading with surgical shoe. -No abx indicated.  -Discussed glucose control and proper protein-rich diet.  -Discussed if any worsening redness, pain, fever or chills to call or may need to report to the emergency room. Patient expressed understanding.   Procedure: Excisional Debridement of Wound Rationale: Removal of non-viable soft tissue from the wound to promote healing.  Anesthesia: none Pre-Debridement Wound Measurements: overlying slough Post-Debridement Wound Measurements: 0.8 cm  x 0.2 cm x 0.2 cm  Type of Debridement: Sharp Excisional Tissue Removed: Non-viable soft tissue Depth of Debridement: subcutaneous tissue. Technique: Sharp excisional debridement to bleeding, viable wound base.  Dressing: Dry, sterile, compression dressing. Disposition: Patient tolerated procedure well. Patient to return in 2 week for follow-up.  Return in about 2 weeks (around 04/10/2023) for wound check.    Return in about 2 weeks (around 04/10/2023) for wound check.

## 2023-04-02 ENCOUNTER — Other Ambulatory Visit: Payer: Self-pay | Admitting: *Deleted

## 2023-04-02 ENCOUNTER — Telehealth: Payer: Self-pay | Admitting: Family Medicine

## 2023-04-02 DIAGNOSIS — E1169 Type 2 diabetes mellitus with other specified complication: Secondary | ICD-10-CM

## 2023-04-02 DIAGNOSIS — E1151 Type 2 diabetes mellitus with diabetic peripheral angiopathy without gangrene: Secondary | ICD-10-CM

## 2023-04-02 MED ORDER — ATORVASTATIN CALCIUM 40 MG PO TABS
40.0000 mg | ORAL_TABLET | Freq: Every day | ORAL | 0 refills | Status: DC
Start: 2023-04-02 — End: 2023-07-10

## 2023-04-02 NOTE — Telephone Encounter (Signed)
Medication Management (Pt said [facility] doesn't accept coupons for farxiga. He wants to know what to do because he can't afford medication.)

## 2023-04-02 NOTE — Telephone Encounter (Signed)
  Prescription Request  04/02/2023  Is this a "Controlled Substance" medicine? no  Have you seen your PCP in the last 2 weeks? 03/12/23  If YES, route message to pool  -  If NO, patient needs to be scheduled for appointment.  What is the name of the medication or equipment? Atorvastatine 40 mg  Have you contacted your pharmacy to request a refill? yes   Which pharmacy would you like this sent to? [facility]   Patient notified that their request is being sent to the clinical staff for review and that they should receive a response within 2 business days.

## 2023-04-03 NOTE — Telephone Encounter (Signed)
Contacted patient and informed that we can try and get him on patient assistance.  I grabbed 3 sample boxes of Farxiga 10 mg and told the patient he could come by and get them  I instructed him to cut the pills in half. This will create a 6 week supply.

## 2023-04-03 NOTE — Telephone Encounter (Signed)
I put in [id number] to pharmacy :) We can help him get Marcelline Deist but it will be 4-6 weeks until patient assistance process complete/med delivered Patient will have to either pay or just continue with metformin/glipizide for now We may have samples if you want to pull for him (but not enough to get him through) If we have [place] 10mg  would have him take samples and cut in half to bridge (and allow others to have samples)

## 2023-04-04 ENCOUNTER — Telehealth: Payer: Self-pay

## 2023-04-04 NOTE — Progress Notes (Signed)
   Care Guide Note  04/04/2023 Name: Evan Nelson MRN: [id number] DOB: 02-Jun-1961  Referred by: Arrie Senate, FNP Reason for referral : Care Coordination (Outreach to schedule with Pharm d )   Evan Nelson is a 62 y.o. year old male who is a primary care patient of Arrie Senate, FNP. [name] was referred to the pharmacist for assistance related to HLD and DM.    Successful contact was made with the patient to discuss pharmacy services including being ready for the pharmacist to call at least 5 minutes before the scheduled appointment time, to have medication bottles and any blood sugar or blood pressure readings ready for review. The patient agreed to meet with the pharmacist via with the pharmacist via telephone visit on (date/time).  05/03/2023  Penne Lash, RMA Care Guide [facility]  [place], [place] [address] Direct Dial: [phone] [email or web address]

## 2023-04-08 ENCOUNTER — Ambulatory Visit (INDEPENDENT_AMBULATORY_CARE_PROVIDER_SITE_OTHER): Payer: Medicare PPO | Admitting: Surgery

## 2023-04-08 ENCOUNTER — Encounter: Payer: Self-pay | Admitting: Surgery

## 2023-04-08 ENCOUNTER — Ambulatory Visit (INDEPENDENT_AMBULATORY_CARE_PROVIDER_SITE_OTHER)
Admission: RE | Admit: 2023-04-08 | Discharge: 2023-04-08 | Disposition: A | Discharge status: Home / Self Care | Payer: Medicare PPO | Source: Ambulatory Visit | Attending: Surgery | Admitting: Surgery

## 2023-04-08 ENCOUNTER — Ambulatory Visit (HOSPITAL_COMMUNITY)
Admission: RE | Admit: 2023-04-08 | Discharge: 2023-04-08 | Disposition: A | Discharge status: Home / Self Care | Payer: Medicare PPO | Source: Ambulatory Visit | Attending: Surgery | Admitting: Surgery

## 2023-04-08 VITALS — BP 141/81 | HR 104 | Temp 97.5°F | Resp 20 | Ht 67.0 in | Wt 196.0 lb

## 2023-04-08 DIAGNOSIS — I739 Peripheral vascular disease, unspecified: Secondary | ICD-10-CM | POA: Diagnosis not present

## 2023-04-08 DIAGNOSIS — I7025 Atherosclerosis of native arteries of other extremities with ulceration: Secondary | ICD-10-CM | POA: Insufficient documentation

## 2023-04-08 LAB — VAS US ABI WITH/WO TBI
Left ABI: 0.78
Right ABI: 0.61

## 2023-04-08 NOTE — Progress Notes (Signed)
Vascular and Vein Specialist of [facility]  Patient name: BENJY SANDLES MRN: [id number] DOB: June 28, 1961 Sex: male   REASON FOR VISIT:    Follow up  HISOTRY OF PRESENT ILLNESS:    PSALMS GARRINGER is a 62 y.o. male who went to the emergency department on 09/08/2022 with pain and swelling in his left foot.  He had bumped his toe about 6 months prior and then dropped something on it again a few months later.  He had a nonhealing wound since that time.  He had been on antibiotics.  His toe was turning more black and so he came to the hospital.  On 09/10/2022, he underwent angiography by Dr. Karin Lieu.  He was found to have a left SFA occlusion which was treated with overlapping 7 mm Eluvia stents followed by angioplasty of the anterior tibial and posterior tibial arteries.  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  When I saw him in June, his wound still has not healed.  He had also had a significant drop in his ABIs.  I scheduled him for angiography which was performed on 02/12/2023.  I found a 25 mm gradient across his left common iliac artery and stented this with a 8 mm stent.  He also had diffuse disease within the left posterior tibial artery that was treated with a laser catheter and a 2-1/2 mm balloon.  He states that there has been a dramatic improvement in his wound which is being managed by the wound center.  He states that there is only a pinpoint sized area left and that he is hoping to get put into regular shoes next week.   The patient is a current smoker. His blood sugars were elevated and he is being worked up for diabetes   PAST MEDICAL HISTORY:   Past Medical History:  Diagnosis Date   Hypertension    Peripheral arterial disease (HCC)    Type 2 diabetes mellitus with other specified complication (HCC)      FAMILY HISTORY:   History reviewed. No pertinent family history.  SOCIAL HISTORY:   Social History   Tobacco Use    Smoking status: Every Day    Types: Cigarettes   Smokeless tobacco: Never  Substance Use Topics   Alcohol use: Not Currently     ALLERGIES:   No Known Allergies   CURRENT MEDICATIONS:   Current Outpatient Medications  Medication Sig Dispense Refill   aspirin EC 81 MG tablet Take 1 tablet (81 mg total) by mouth daily. Swallow whole. 30 tablet 12   atorvastatin (LIPITOR) 40 MG tablet Take 1 tablet (40 mg total) by mouth at bedtime. 90 tablet 0   Blood Glucose Monitoring Suppl (ACCU-CHEK GUIDE ME) w/Device KIT Use in the morning, at noon, and at bedtime. 1 kit 0   clopidogrel (PLAVIX) 75 MG tablet Take 1 tablet (75 mg total) by mouth daily. 30 tablet 11   dapagliflozin propanediol (FARXIGA) 10 MG TABS tablet Take 1 tablet (10 mg total) by mouth daily before breakfast. 30 tablet 11   docusate sodium (COLACE) 100 MG capsule Take 1 capsule (100 mg total) by mouth 2 (two) times daily. 10 capsule 0   glipiZIDE (GLUCOTROL) 5 MG tablet Take 1/2 tablet (2.5 mg total) by mouth 2 (two) times daily. 30 tablet 11   glucose blood (PIP BLOOD GLUCOSE TEST STRIP) test strip Use as instructed 100 each 12   losartan (COZAAR) 25 MG tablet Take 1 tablet (25  mg total) by mouth daily. 90 tablet 0   metFORMIN (GLUCOPHAGE) 1000 MG tablet Take 1 tablet (1,000 mg total) by mouth 2 (two) times daily with a meal. 180 tablet 3   No current facility-administered medications for this visit.    REVIEW OF SYSTEMS:   [X]  denotes positive finding, [ ]  denotes negative finding Cardiac  Comments:  Chest pain or chest pressure:    Shortness of breath upon exertion:    Short of breath when lying flat:    Irregular heart rhythm:        Vascular    Pain in calf, thigh, or hip brought on by ambulation:    Pain in feet at night that wakes you up from your sleep:     Blood clot in your veins:    Leg swelling:         Pulmonary    Oxygen at home:    Productive cough:     Wheezing:         Neurologic    Sudden  weakness in arms or legs:     Sudden numbness in arms or legs:     Sudden onset of difficulty speaking or slurred speech:    Temporary loss of vision in one eye:     Problems with dizziness:         Gastrointestinal    Blood in stool:     Vomited blood:         Genitourinary    Burning when urinating:     Blood in urine:        Psychiatric    Major depression:         Hematologic    Bleeding problems:    Problems with blood clotting too easily:        Skin    Rashes or ulcers: x       Constitutional    Fever or chills:      PHYSICAL EXAM:   Vitals:   04/08/23 1348  BP: (!) 141/81  Pulse: (!) 104  Resp: 20  Temp: (!) 97.5 F (36.4 C)  SpO2: 97%  Weight: 196 lb (88.9 kg)  Height: 5\' 7"  (1.702 m)    GENERAL: The patient is a well-nourished male, in no acute distress. The vital signs are documented above. CARDIAC: There is a regular rate and rhythm.  PULMONARY: Non-labored respirations ABDOMEN: Soft and non-tender  MUSCULOSKELETAL: There are no major deformities or cyanosis. NEUROLOGIC: No focal weakness or paresthesias are detected. SKIN: Left foot dressing intact PSYCHIATRIC: The patient has a normal affect.  STUDIES:   I have reviewed the following studies: ABI/TBIToday's ABIToday's TBIPrevious ABIPrevious TBI  +-------+-----------+-----------+------------+------------+  Right 0.61       0.29       0.75        0.34          +-------+-----------+-----------+------------+------------+  Left  0.78       0.45       0.65        0.45          +-------+-----------+-----------+------------+------------+  Right toe pressure: 42 Left toe pressure: 65  Left: 30-49% stenosis noted in the common femoral artery. 30-49% stenosis  noted in the deep femoral artery. 50-74% stenosis noted in the posterior  tibial artery. Patent stent with no evidence of stenosis in the  superficial femoral artery.  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  He is getting to the point where his wound has nearly healed.  We will continue with ongoing surveillance.  His next study will be in 3 months.    Charlena Cross, MD, FACS [facility]ists of [facility] [phone] Pager [phone]

## 2023-04-10 ENCOUNTER — Ambulatory Visit (INDEPENDENT_AMBULATORY_CARE_PROVIDER_SITE_OTHER): Payer: Medicare PPO | Admitting: Podiatry

## 2023-04-10 ENCOUNTER — Encounter: Payer: Self-pay | Admitting: Podiatry

## 2023-04-10 DIAGNOSIS — E1169 Type 2 diabetes mellitus with other specified complication: Secondary | ICD-10-CM

## 2023-04-10 DIAGNOSIS — M79675 Pain in left toe(s): Secondary | ICD-10-CM

## 2023-04-10 DIAGNOSIS — B351 Tinea unguium: Secondary | ICD-10-CM

## 2023-04-10 DIAGNOSIS — M79674 Pain in right toe(s): Secondary | ICD-10-CM | POA: Diagnosis not present

## 2023-04-10 DIAGNOSIS — I739 Peripheral vascular disease, unspecified: Secondary | ICD-10-CM

## 2023-04-10 NOTE — Progress Notes (Signed)
Subjective:  Patient ID: Evan Nelson, male    DOB: November 17, 1960,  MRN: [id number]  Chief Complaint  Patient presents with   Wound Check    2 weeks (around 04/10/2023) for wound check.    DOS: 09/11/22 Procedure: Left fifth toe amputation  62 y.o. male returns for post surgical wound dehiscence. Relates doing well and pain improved Believes it is healed.   Also has concern of thickened elongated and painful nails that are difficult to trim. Requesting to have them trimmed today. Relates burning and tingling in their feet. Patient is diabetic and last A1c was  Lab Results  Component Value Date   HGBA1C 6.6 (H) 03/12/2023   .   PCP:  Arrie Senate, FNP   .   Review of Systems: Negative except as noted in the HPI. Denies N/V/F/Ch.  Past Medical History:  Diagnosis Date   Hypertension    Peripheral arterial disease (HCC)    Type 2 diabetes mellitus with other specified complication (HCC)     Current Outpatient Medications:    aspirin EC 81 MG tablet, Take 1 tablet (81 mg total) by mouth daily. Swallow whole., Disp: 30 tablet, Rfl: 12   atorvastatin (LIPITOR) 40 MG tablet, Take 1 tablet (40 mg total) by mouth at bedtime., Disp: 90 tablet, Rfl: 0   Blood Glucose Monitoring Suppl (ACCU-CHEK GUIDE ME) w/Device KIT, Use in the morning, at noon, and at bedtime., Disp: 1 kit, Rfl: 0   clopidogrel (PLAVIX) 75 MG tablet, Take 1 tablet (75 mg total) by mouth daily., Disp: 30 tablet, Rfl: 11   dapagliflozin propanediol (FARXIGA) 10 MG TABS tablet, Take 1 tablet (10 mg total) by mouth daily before breakfast., Disp: 30 tablet, Rfl: 11   docusate sodium (COLACE) 100 MG capsule, Take 1 capsule (100 mg total) by mouth 2 (two) times daily., Disp: 10 capsule, Rfl: 0   glipiZIDE (GLUCOTROL) 5 MG tablet, Take 1/2 tablet (2.5 mg total) by mouth 2 (two) times daily., Disp: 30 tablet, Rfl: 11   glucose blood (PIP BLOOD GLUCOSE TEST STRIP) test strip, Use as instructed, Disp: 100 each, Rfl:  12   losartan (COZAAR) 25 MG tablet, Take 1 tablet (25 mg total) by mouth daily., Disp: 90 tablet, Rfl: 0   metFORMIN (GLUCOPHAGE) 1000 MG tablet, Take 1 tablet (1,000 mg total) by mouth 2 (two) times daily with a meal., Disp: 180 tablet, Rfl: 3  Social History   Tobacco Use  Smoking Status Every Day   Types: Cigarettes  Smokeless Tobacco Never    No Known Allergies Objective:  There were no vitals filed for this visit. There is no height or weight on file to calculate BMI. Vascular: DP/PT pulses 2/4 bilateral. CFT <3 seconds. Absent hair growth on digits. Edema noted to bilateral lower extremities. Xerosis noted bilaterally.  Skin. No lacerations or abrasions bilateral feet. Nails 1-4 bilateral and right fifth l  are thickened discolored and elongated with subungual debris.  Musculoskeletal: MMT 5/5 bilateral lower extremities in DF, PF, Inversion and Eversion. Deceased ROM in DF of ankle joint. Amputation of left fifth ray.  Neurological: Sensation intact to light touch. Protective sensation diminished bilateral.    Radiographs: Interval resection of left fifth ray.  Assessment:   1. Pain due to onychomycosis of toenails of both feet   2. Peripheral vascular disease (HCC)   3. Type 2 diabetes mellitus with other specified complication, without long-term current use of insulin (HCC)  Plan:  Patient was evaluated and treated and all questions answered.   Ulcer left foot -healed.  -Debridement of hyperkeratotic tissue. -May return to regular shoes.  -Will get schedule for DM shoes.  -No abx indicated.  -Discussed glucose control and proper protein-rich diet.  -Discussed if any worsening redness, pain, fever or chills to call or may need to report to the emergency room. Patient expressed understanding.  -Discussed and educated patient on diabetic foot care, especially with  regards to the vascular, neurological and musculoskeletal systems.  -Stressed the  importance of good glycemic control and the detriment of not  controlling glucose levels in relation to the foot. -Discussed supportive shoes at all times and checking feet regularly.  -Mechanically debrided all nails 1-5 bilateral using sterile nail nipper and filed with dremel without incident  -Answered all patient questions -Patient to return  in 3 months for at risk foot care -Patient advised to call the office if any problems or questions arise in the meantime.   Return in about 3 months (around 07/10/2023) for rfc.    Return in about 3 months (around 07/10/2023) for rfc.

## 2023-04-17 ENCOUNTER — Other Ambulatory Visit: Payer: Self-pay

## 2023-04-17 DIAGNOSIS — I739 Peripheral vascular disease, unspecified: Secondary | ICD-10-CM

## 2023-04-24 ENCOUNTER — Telehealth: Payer: Self-pay

## 2023-04-24 DIAGNOSIS — E1169 Type 2 diabetes mellitus with other specified complication: Secondary | ICD-10-CM

## 2023-04-24 NOTE — Telephone Encounter (Signed)
Emailed AZ&ME application for [place] to [employer]

## 2023-04-26 ENCOUNTER — Ambulatory Visit: Payer: Medicare PPO

## 2023-04-26 DIAGNOSIS — L97522 Non-pressure chronic ulcer of other part of left foot with fat layer exposed: Secondary | ICD-10-CM

## 2023-04-26 DIAGNOSIS — I739 Peripheral vascular disease, unspecified: Secondary | ICD-10-CM

## 2023-04-26 DIAGNOSIS — E1169 Type 2 diabetes mellitus with other specified complication: Secondary | ICD-10-CM

## 2023-05-01 NOTE — Progress Notes (Signed)
Patient presents to the office today for diabetic shoe and insole measuring.  Patient was measured with brannock device to determine size and width for 1 pair of extra depth shoes and foam casted for 3 pair of insoles.   Documentation of medical necessity will be sent to patient's treating diabetic doctor to verify and sign.   Patient's diabetic provider: gabriel capps [place]   Shoes and insoles will be ordered at that time and patient will be notified for an appointment for fitting when they arrive.   Shoe size (per patient): 11 Brannock measurement: 11 Patient shoe selection- Shoe choice:   A6000M / 585 Shoe size ordered: 11WD  Addison Bailey CPed, CFo, CFm

## 2023-05-03 ENCOUNTER — Other Ambulatory Visit: Payer: Medicare PPO

## 2023-05-03 NOTE — Progress Notes (Signed)
   05/03/2023 Name: Evan Nelson MRN: [id number] DOB: 1960/11/09  Unsuccessful outreach to patient with VM box full.  Will attempt to have patient rescheduled for patient assistance.  Kieth Brightly, PharmD, BCACP, CPP Clinical Pharmacist, [facility]

## 2023-06-07 ENCOUNTER — Ambulatory Visit (INDEPENDENT_AMBULATORY_CARE_PROVIDER_SITE_OTHER): Payer: Medicare HMO

## 2023-06-07 DIAGNOSIS — L97522 Non-pressure chronic ulcer of other part of left foot with fat layer exposed: Secondary | ICD-10-CM | POA: Diagnosis not present

## 2023-06-07 DIAGNOSIS — E1169 Type 2 diabetes mellitus with other specified complication: Secondary | ICD-10-CM

## 2023-06-07 DIAGNOSIS — I739 Peripheral vascular disease, unspecified: Secondary | ICD-10-CM

## 2023-06-07 DIAGNOSIS — M79675 Pain in left toe(s): Secondary | ICD-10-CM

## 2023-06-07 NOTE — Progress Notes (Signed)
Patient presents today to pick up diabetic shoes and insoles.  Patient was dispensed 1 pair of diabetic shoes and 3 pairs of foam casted diabetic insoles. Fit was satisfactory. Instructions for break-in and wear was reviewed and a copy was given to the patient.   Re-appointment for regularly scheduled diabetic foot care visits or if they should experience any trouble with the shoes or insoles.  Patient was very pleased with fit and function and will call if any problems arise Addison Bailey Cped, CFo, CFm

## 2023-06-13 ENCOUNTER — Ambulatory Visit: Payer: Medicare HMO | Admitting: Family Medicine

## 2023-06-13 ENCOUNTER — Encounter: Payer: Self-pay | Admitting: Family Medicine

## 2023-06-28 NOTE — Telephone Encounter (Signed)
Rec'd patients income via email.

## 2023-06-30 ENCOUNTER — Other Ambulatory Visit: Payer: Self-pay | Admitting: Family Medicine

## 2023-07-08 ENCOUNTER — Encounter (HOSPITAL_COMMUNITY): Payer: Medicare PPO

## 2023-07-08 ENCOUNTER — Ambulatory Visit: Payer: Medicare PPO

## 2023-07-09 ENCOUNTER — Ambulatory Visit: Payer: Medicare HMO | Admitting: Family Medicine

## 2023-07-09 ENCOUNTER — Ambulatory Visit (INDEPENDENT_AMBULATORY_CARE_PROVIDER_SITE_OTHER): Payer: Medicare HMO

## 2023-07-09 ENCOUNTER — Ambulatory Visit (INDEPENDENT_AMBULATORY_CARE_PROVIDER_SITE_OTHER): Payer: Medicare HMO | Admitting: Physician Assistant

## 2023-07-09 ENCOUNTER — Other Ambulatory Visit: Payer: Self-pay

## 2023-07-09 VITALS — BP 144/78 | HR 98 | Ht 67.0 in | Wt 198.2 lb

## 2023-07-09 DIAGNOSIS — I7025 Atherosclerosis of native arteries of other extremities with ulceration: Secondary | ICD-10-CM

## 2023-07-09 DIAGNOSIS — I739 Peripheral vascular disease, unspecified: Secondary | ICD-10-CM

## 2023-07-09 LAB — VAS US ABI WITH/WO TBI
Left ABI: 0.78
Right ABI: 0.57

## 2023-07-09 NOTE — Progress Notes (Signed)
Office Note     CC:  follow up Requesting Provider:  Arrie Senate*  HPI: Evan Nelson is a 62 y.o. ([birth year]) male who presents for surveillance of PAD.  He underwent SFA angioplasty and stenting as well as angioplasty of the ATA and PTA by Dr. Karin Lieu on 09/10/2022 due to nonhealing toe wound of the left foot.  Subsequently he had 1/5 toe amputation by podiatry.  He experienced a significant drop in his ABIs and thus underwent repeat angiography in July 2024 involving left common iliac artery stenting as well as PTA laser atherectomy and balloon angioplasty by Dr. Myra Gianotti.  He receives wound care by podiatry.  He states the wound has completely healed.  He denies any claudication, rest pain, or further tissue loss.  He is on a daily aspirin, Plavix, statin.  He denies tobacco use.   Past Medical History:  Diagnosis Date   Hypertension    Peripheral arterial disease (HCC)    Type 2 diabetes mellitus with other specified complication [facility])     Past Surgical History:  Procedure Laterality Date   ABDOMINAL AORTOGRAM W/LOWER EXTREMITY N/A 09/10/2022   Procedure: ABDOMINAL AORTOGRAM W/LOWER EXTREMITY;  Surgeon: Victorino Sparrow, MD;  Location[facility] INVASIVE CV LAB;  Service: Cardiovascular;  Laterality: N/A;   ABDOMINAL AORTOGRAM W/LOWER EXTREMITY N/A 02/12/2023   Procedure: ABDOMINAL AORTOGRAM W/LOWER EXTREMITY;  Surgeon: Nada Libman, MD;  Location: [facility] INVASIVE CV LAB;  Service: Cardiovascular;  Laterality: N/A;   AMPUTATION TOE Left 09/11/2022   Procedure: AMPUTATION LEFT FIFTH TOE;  Surgeon: Louann Sjogren, DPM;  Location: [facility] OR;  Service: Podiatry;  Laterality: Left;   PERIPHERAL VASCULAR ATHERECTOMY  02/12/2023   Procedure: PERIPHERAL VASCULAR ATHERECTOMY;  Surgeon: Nada Libman, MD;  Location: [facility] INVASIVE CV LAB;  Service: Cardiovascular;;  Lt PT   PERIPHERAL VASCULAR BALLOON ANGIOPLASTY Left 09/10/2022   Procedure: PERIPHERAL VASCULAR BALLOON ANGIOPLASTY;  Surgeon:  Victorino Sparrow, MD;  Location: [facility] INVASIVE CV LAB;  Service: Cardiovascular;  Laterality: Left;  Tibials   PERIPHERAL VASCULAR BALLOON ANGIOPLASTY  02/12/2023   Procedure: PERIPHERAL VASCULAR BALLOON ANGIOPLASTY;  Surgeon: Nada Libman, MD;  Location: [facility] INVASIVE CV LAB;  Service: Cardiovascular;;  Lt PT   PERIPHERAL VASCULAR INTERVENTION Left 09/10/2022   Procedure: PERIPHERAL VASCULAR INTERVENTION;  Surgeon: Victorino Sparrow, MD;  Location: [facility] INVASIVE CV LAB;  Service: Cardiovascular;  Laterality: Left;  SFA   PERIPHERAL VASCULAR INTERVENTION Left 02/12/2023   Procedure: PERIPHERAL VASCULAR INTERVENTION;  Surgeon: Nada Libman, MD;  Location: [facility] INVASIVE CV LAB;  Service: Cardiovascular;  Laterality: Left;  LCIA    Social History   Socioeconomic History   Marital status: Divorced    Spouse name: Not on file   Number of children: Not on file   Years of education: Not on file   Highest education level: Not on file  Occupational History   Not on file  Tobacco Use   Smoking status: Every Day    Types: Cigarettes   Smokeless tobacco: Never  Vaping Use   Vaping status: Never Used  Substance and Sexual Activity   Alcohol use: Not Currently   Drug use: Not on file   Sexual activity: Not on file  Other Topics Concern   Not on file  Social History Narrative   Not on file   Social Determinants of Health   Financial Resource Strain: Low Risk  (01/24/2023)   Overall Financial Resource Strain (CARDIA)    Difficulty of  Paying Living Expenses: Not hard at all  Food Insecurity: No Food Insecurity (01/24/2023)   Hunger Vital Sign    Worried About Running Out of Food in the Last Year: Never true    Ran Out of Food in the Last Year: Never true  Transportation Needs: No Transportation Needs (01/24/2023)   PRAPARE - Administrator, Civil Service (Medical): No    Lack of Transportation (Non-Medical): No  Physical Activity: Insufficiently Active (01/24/2023)   Exercise Vital  Sign    Days of Exercise per Week: 3 days    Minutes of Exercise per Session: 30 min  Stress: No Stress Concern Present (01/24/2023)   [employer] of Occupational Health - Occupational Stress Questionnaire    Feeling of Stress : Not at all  Social Connections: Moderately Isolated (01/24/2023)   Social Connection and Isolation Panel [NHANES]    Frequency of Communication with Friends and Family: More than three times a week    Frequency of Social Gatherings with Friends and Family: More than three times a week    Attends Religious Services: More than 4 times per year    Active Member of [employer] or Organizations: No    Attends Banker Meetings: Never    Marital Status: Divorced  Catering manager Violence: Not At Risk (01/24/2023)   Humiliation, Afraid, Rape, and Kick questionnaire    Fear of Current or Ex-Partner: No    Emotionally Abused: No    Physically Abused: No    Sexually Abused: No   History reviewed. No pertinent family history.  Current Outpatient Medications  Medication Sig Dispense Refill   aspirin EC 81 MG tablet Take 1 tablet (81 mg total) by mouth daily. Swallow whole. 30 tablet 12   atorvastatin (LIPITOR) 40 MG tablet Take 1 tablet (40 mg total) by mouth at bedtime. 90 tablet 0   Blood Glucose Monitoring Suppl (ACCU-CHEK GUIDE ME) w/Device KIT Use in the morning, at noon, and at bedtime. 1 kit 0   clopidogrel (PLAVIX) 75 MG tablet Take 1 tablet (75 mg total) by mouth daily. 30 tablet 11   dapagliflozin propanediol (FARXIGA) 10 MG TABS tablet Take 1 tablet (10 mg total) by mouth daily before breakfast. 30 tablet 11   docusate sodium (COLACE) 100 MG capsule Take 1 capsule (100 mg total) by mouth 2 (two) times daily. 10 capsule 0   glipiZIDE (GLUCOTROL) 5 MG tablet Take 1/2 tablet (2.5 mg total) by mouth 2 (two) times daily. 30 tablet 11   glucose blood (PIP BLOOD GLUCOSE TEST STRIP) test strip Use as instructed 100 each 12   losartan (COZAAR) 25 MG tablet  TAKE 1 TABLET BY MOUTH DAILY 90 tablet 0   metFORMIN (GLUCOPHAGE) 1000 MG tablet Take 1 tablet (1,000 mg total) by mouth 2 (two) times daily with a meal. 180 tablet 3   No current facility-administered medications for this visit.    No Known Allergies   REVIEW OF SYSTEMS:   [X]  denotes positive finding, [ ]  denotes negative finding Cardiac  Comments:  Chest pain or chest pressure:    Shortness of breath upon exertion:    Short of breath when lying flat:    Irregular heart rhythm:        Vascular    Pain in calf, thigh, or hip brought on by ambulation:    Pain in feet at night that wakes you up from your sleep:     Blood clot in your veins:  Leg swelling:         Pulmonary    Oxygen at home:    Productive cough:     Wheezing:         Neurologic    Sudden weakness in arms or legs:     Sudden numbness in arms or legs:     Sudden onset of difficulty speaking or slurred speech:    Temporary loss of vision in one eye:     Problems with dizziness:         Gastrointestinal    Blood in stool:     Vomited blood:         Genitourinary    Burning when urinating:     Blood in urine:        Psychiatric    Major depression:         Hematologic    Bleeding problems:    Problems with blood clotting too easily:        Skin    Rashes or ulcers:        Constitutional    Fever or chills:      PHYSICAL EXAMINATION:  Vitals:   07/09/23 1425  BP: (!) 144/78  Pulse: 98  Weight: 198 lb 3.2 oz (89.9 kg)  Height: 5\' 7"  (1.702 m)    General:  WDWN in NAD; vital signs documented above Gait: Not observed HENT: WNL, normocephalic Pulmonary: normal non-labored breathing , without Rales, rhonchi,  wheezing Cardiac: regular HR Abdomen: soft, NT, no masses Skin: without rashes Vascular Exam/Pulses: palpable DP and PT 1+ LLE Extremities: without ischemic changes, without Gangrene , without cellulitis; without open wounds; callus L 5th toe amp site Musculoskeletal: no muscle  wasting or atrophy  Neurologic: A&O X 3 Psychiatric:  The pt has Normal affect.   Non-Invasive Vascular Imaging:   Patent left SFA Patent left PT and AT Peroneal artery is occluded  ABI/TBIToday's ABIToday's TBIPrevious ABIPrevious TBI  +-------+-----------+-----------+------------+------------+  Right 0.57       0.26       0.61        0.29          +-------+-----------+-----------+------------+------------+  Left  0.78       0.64       0.78        0.45           ASSESSMENT/PLAN:: 62 y.o. male here for follow up for surveillance of PAD with numerous endovascular interventions of the left lower extremity  Left fifth toe amp site is well-healed except for a callus formation.  He continues to see podiatry for care in this area.  He walks with diabetic shoes.  Duplex demonstrates patent SFA and PTA and ATA.  He has a palpable DP and PT pulse on exam.  He will continue his aspirin, Plavix, statin daily.  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  We will repeat his left lower extremity arterial duplex aortoiliac duplex and ABI in 6 months.   Emilie Rutter, PA-C [facility] [phone]  Clinic MD:   Hetty Blend on call

## 2023-07-10 ENCOUNTER — Other Ambulatory Visit: Payer: Self-pay | Admitting: Family Medicine

## 2023-07-10 DIAGNOSIS — E1151 Type 2 diabetes mellitus with diabetic peripheral angiopathy without gangrene: Secondary | ICD-10-CM

## 2023-07-15 ENCOUNTER — Encounter: Payer: Self-pay | Admitting: Podiatry

## 2023-07-15 ENCOUNTER — Ambulatory Visit (INDEPENDENT_AMBULATORY_CARE_PROVIDER_SITE_OTHER): Payer: Medicare HMO | Admitting: Podiatry

## 2023-07-15 DIAGNOSIS — B351 Tinea unguium: Secondary | ICD-10-CM

## 2023-07-15 DIAGNOSIS — M79675 Pain in left toe(s): Secondary | ICD-10-CM | POA: Diagnosis not present

## 2023-07-15 DIAGNOSIS — E1169 Type 2 diabetes mellitus with other specified complication: Secondary | ICD-10-CM

## 2023-07-15 DIAGNOSIS — M79674 Pain in right toe(s): Secondary | ICD-10-CM

## 2023-07-15 DIAGNOSIS — I739 Peripheral vascular disease, unspecified: Secondary | ICD-10-CM | POA: Diagnosis not present

## 2023-07-15 NOTE — Progress Notes (Signed)
Subjective:  Patient ID: Evan Nelson, male    DOB: 1960/10/18,  MRN: [id number]  Chief Complaint  Patient presents with   Nail Problem    Dfc.    62 y.o. male returns for concern of thickened elongated and painful nails that are difficult to trim. Requesting to have them trimmed today. Relates burning and tingling in their feet. Patient is diabetic and last A1c was  Lab Results  Component Value Date   HGBA1C 6.6 (H) 03/12/2023   .   PCP:  Arrie Senate, FNP   .   Review of Systems: Negative except as noted in the HPI. Denies N/V/F/Ch.  Past Medical History:  Diagnosis Date   Hypertension    Peripheral arterial disease (HCC)    Type 2 diabetes mellitus with other specified complication (HCC)     Current Outpatient Medications:    aspirin EC 81 MG tablet, Take 1 tablet (81 mg total) by mouth daily. Swallow whole., Disp: 30 tablet, Rfl: 12   atorvastatin (LIPITOR) 40 MG tablet, TAKE 1 TABLET BY MOUTH AT BEDTIME, Disp: 90 tablet, Rfl: 0   Blood Glucose Monitoring Suppl (ACCU-CHEK GUIDE ME) w/Device KIT, Use in the morning, at noon, and at bedtime., Disp: 1 kit, Rfl: 0   clopidogrel (PLAVIX) 75 MG tablet, Take 1 tablet (75 mg total) by mouth daily., Disp: 30 tablet, Rfl: 11   dapagliflozin propanediol (FARXIGA) 10 MG TABS tablet, Take 1 tablet (10 mg total) by mouth daily before breakfast., Disp: 30 tablet, Rfl: 11   docusate sodium (COLACE) 100 MG capsule, Take 1 capsule (100 mg total) by mouth 2 (two) times daily., Disp: 10 capsule, Rfl: 0   glipiZIDE (GLUCOTROL) 5 MG tablet, Take 1/2 tablet (2.5 mg total) by mouth 2 (two) times daily., Disp: 30 tablet, Rfl: 11   glucose blood (PIP BLOOD GLUCOSE TEST STRIP) test strip, Use as instructed, Disp: 100 each, Rfl: 12   losartan (COZAAR) 25 MG tablet, TAKE 1 TABLET BY MOUTH DAILY, Disp: 90 tablet, Rfl: 0   metFORMIN (GLUCOPHAGE) 1000 MG tablet, Take 1 tablet (1,000 mg total) by mouth 2 (two) times daily with a meal.,  Disp: 180 tablet, Rfl: 3  Social History   Tobacco Use  Smoking Status Every Day   Types: Cigarettes  Smokeless Tobacco Never    No Known Allergies Objective:  There were no vitals filed for this visit. There is no height or weight on file to calculate BMI. Vascular: DP/PT pulses 2/4 bilateral. CFT <3 seconds. Absent hair growth on digits. Edema noted to bilateral lower extremities. Xerosis noted bilaterally.  Skin. No lacerations or abrasions bilateral feet. Nails 1-4 bilateral and right fifth l  are thickened discolored and elongated with subungual debris.  Musculoskeletal: MMT 5/5 bilateral lower extremities in DF, PF, Inversion and Eversion. Deceased ROM in DF of ankle joint. Amputation of left fifth ray.  Neurological: Sensation intact to light touch. Protective sensation diminished bilateral.    Radiographs: Interval resection of left fifth ray.  Assessment:   1. Pain due to onychomycosis of toenails of both feet   2. Type 2 diabetes mellitus with other specified complication, without long-term current use of insulin (HCC)   3. Peripheral vascular disease (HCC)            Plan:   -Discussed if any worsening redness, pain, fever or chills to call or may need to report to the emergency room. Patient expressed understanding.  -Discussed and educated patient on diabetic foot care,  especially with  regards to the vascular, neurological and musculoskeletal systems.  -Stressed the importance of good glycemic control and the detriment of not  controlling glucose levels in relation to the foot. -Discussed supportive shoes at all times and checking feet regularly.  -Mechanically debrided all nails 1-5 bilateral using sterile nail nipper and filed with dremel without incident  -Answered all patient questions -Patient to return  in 3 months for at risk foot care -Patient advised to call the office if any problems or questions arise in the meantime.   No follow-ups on  file.    No follow-ups on file.

## 2023-07-16 ENCOUNTER — Encounter: Payer: Self-pay | Admitting: Family Medicine

## 2023-07-16 ENCOUNTER — Ambulatory Visit: Payer: Medicare HMO | Admitting: Family Medicine

## 2023-07-25 ENCOUNTER — Ambulatory Visit: Payer: Medicare HMO | Admitting: Family Medicine

## 2023-07-25 ENCOUNTER — Encounter: Payer: Self-pay | Admitting: Family Medicine

## 2023-07-25 VITALS — BP 128/71 | HR 98 | Temp 98.5°F | Ht 67.0 in | Wt 196.6 lb

## 2023-07-25 DIAGNOSIS — E1169 Type 2 diabetes mellitus with other specified complication: Secondary | ICD-10-CM | POA: Diagnosis not present

## 2023-07-25 DIAGNOSIS — I739 Peripheral vascular disease, unspecified: Secondary | ICD-10-CM | POA: Diagnosis not present

## 2023-07-25 DIAGNOSIS — E785 Hyperlipidemia, unspecified: Secondary | ICD-10-CM

## 2023-07-25 DIAGNOSIS — Z7984 Long term (current) use of oral hypoglycemic drugs: Secondary | ICD-10-CM | POA: Diagnosis not present

## 2023-07-25 DIAGNOSIS — R809 Proteinuria, unspecified: Secondary | ICD-10-CM | POA: Diagnosis not present

## 2023-07-25 LAB — BAYER DCA HB A1C WAIVED: HB A1C (BAYER DCA - WAIVED): 6.2 % — ABNORMAL HIGH (ref 4.8–5.6)

## 2023-07-25 NOTE — Progress Notes (Signed)
Subjective:  Patient ID: Evan Nelson, male    DOB: [birth year], 62 y.o.   MRN: [id number]  Patient Care Team: Ellamae Sia Aleen Campi, FNP as PCP - General (Family Medicine)   Chief Complaint:  Medical Management of Chronic Issues (3 month follow up)   HPI: Evan Nelson is a 62 y.o. male presenting on 07/25/2023 for Medical Management of Chronic Issues (3 month follow up)  HPI 1. Type 2 diabetes mellitus with other specified complication, without long-term current use of insulin (HCC) Glucometer:   High at home: 91-111; Low at home: 65, Taking medication(s): glipizide, farxiga, and metformin.  States that Marcelline Deist is very expensive. States that he has 7 more days of it.   Last eye exam: states that it is scheduled with my eye doctor in [place] on 08/11/23  Last foot exam: completed with podiatry, due 03/11/24 Last A1c:  Lab Results  Component Value Date   HGBA1C 6.2 (H) 07/25/2023   Nephropathy screen indicated?: completed today to monitor  Last flu, zoster and/or pneumovax:  There is no immunization history on file for this patient.  ROS: Denies dizziness, LOC, polyuria, polydipsia, unintended weight loss/gain, numbness or tingling in extremities, shortness of breath or chest pain.  Continues to follow with podiatry   2. Microalbuminuria Denies symptoms.   3. Peripheral arterial disease (HCC) Continues to follow up with vascular. States that he was seen 2 weeks ago and plans to go back in 3 months.   4. Hyperlipidemia associated with type 2 diabetes mellitus (HCC) Lipid/Cholesterol, Follow-up  Last lipid panel Other pertinent labs  Lab Results  Component Value Date   CHOL 155 09/11/2022   HDL 28 (L) 09/11/2022   LDLCALC 112 (H) 09/11/2022   TRIG 76 09/11/2022   CHOLHDL 5.5 09/11/2022   Lab Results  Component Value Date   ALT 19 11/08/2022   AST 15 11/08/2022   PLT 420 11/08/2022   TSH 0.825 11/08/2022     He was last seen for this 3 months ago.   Management since that visit includes lipitor.  He reports excellent compliance with treatment. He is not having side effects.   Symptoms: No chest pain No chest pressure/discomfort  No dyspnea No lower extremity edema  No numbness or tingling of extremity No orthopnea  No palpitations No paroxysmal nocturnal dyspnea  No speech difficulty No syncope   Current diet: in general, a "healthy" diet   states that he is cutting out bread and is baking his foods. Staying away from sugary drinks.  Current exercise: yard work Walking at [employer] in [place]   The 10-year ASCVD risk score (Arnett DK, et al., 2019) is: 30.7%  ---------------------------------------------------------------------------------------------------  Relevant past medical, surgical, family, and social history reviewed and updated as indicated.  Allergies and medications reviewed and updated. Data reviewed: Chart in Epic.   Past Medical History:  Diagnosis Date   Hypertension    Peripheral arterial disease (HCC)    Type 2 diabetes mellitus with other specified complication [facility])     Past Surgical History:  Procedure Laterality Date   ABDOMINAL AORTOGRAM W/LOWER EXTREMITY N/A 09/10/2022   Procedure: ABDOMINAL AORTOGRAM W/LOWER EXTREMITY;  Surgeon: Victorino Sparrow, MD;  Location: [facility] INVASIVE CV LAB;  Service: Cardiovascular;  Laterality: N/A;   ABDOMINAL AORTOGRAM W/LOWER EXTREMITY N/A 02/12/2023   Procedure: ABDOMINAL AORTOGRAM W/LOWER EXTREMITY;  Surgeon: Nada Libman, MD;  Location: [facility] INVASIVE CV LAB;  Service: Cardiovascular;  Laterality: N/A;   AMPUTATION  TOE Left 09/11/2022   Procedure: AMPUTATION LEFT FIFTH TOE;  Surgeon: Louann Sjogren, DPM;  Location: [facility] OR;  Service: Podiatry;  Laterality: Left;   PERIPHERAL VASCULAR ATHERECTOMY  02/12/2023   Procedure: PERIPHERAL VASCULAR ATHERECTOMY;  Surgeon: Nada Libman, MD;  Location: [facility] INVASIVE CV LAB;  Service: Cardiovascular;;  Lt PT   PERIPHERAL  VASCULAR BALLOON ANGIOPLASTY Left 09/10/2022   Procedure: PERIPHERAL VASCULAR BALLOON ANGIOPLASTY;  Surgeon: Victorino Sparrow, MD;  Location[facility] INVASIVE CV LAB;  Service: Cardiovascular;  Laterality: Left;  Tibials   PERIPHERAL VASCULAR BALLOON ANGIOPLASTY  02/12/2023   Procedure: PERIPHERAL VASCULAR BALLOON ANGIOPLASTY;  Surgeon: Nada Libman, MD;  Location: [facility] INVASIVE CV LAB;  Service: Cardiovascular;;  Lt PT   PERIPHERAL VASCULAR INTERVENTION Left 09/10/2022   Procedure: PERIPHERAL VASCULAR INTERVENTION;  Surgeon: Victorino Sparrow, MD;  Location: [facility] INVASIVE CV LAB;  Service: Cardiovascular;  Laterality: Left;  SFA   PERIPHERAL VASCULAR INTERVENTION Left 02/12/2023   Procedure: PERIPHERAL VASCULAR INTERVENTION;  Surgeon: Nada Libman, MD;  Location: [facility] INVASIVE CV LAB;  Service: Cardiovascular;  Laterality: Left;  LCIA    Social History   Socioeconomic History   Marital status: Divorced    Spouse name: Not on file   Number of children: Not on file   Years of education: Not on file   Highest education level: Not on file  Occupational History   Not on file  Tobacco Use   Smoking status: Every Day    Types: Cigarettes   Smokeless tobacco: Never  Vaping Use   Vaping status: Never Used  Substance and Sexual Activity   Alcohol use: Not Currently   Drug use: Not on file   Sexual activity: Not on file  Other Topics Concern   Not on file  Social History Narrative   Not on file   Social Drivers of Health   Financial Resource Strain: Low Risk  (01/24/2023)   Overall Financial Resource Strain (CARDIA)    Difficulty of Paying Living Expenses: Not hard at all  Food Insecurity: No Food Insecurity (01/24/2023)   Hunger Vital Sign    Worried About Running Out of Food in the Last Year: Never true    Ran Out of Food in the Last Year: Never true  Transportation Needs: No Transportation Needs (01/24/2023)   PRAPARE - Administrator, Civil Service (Medical): No    Lack of  Transportation (Non-Medical): No  Physical Activity: Insufficiently Active (01/24/2023)   Exercise Vital Sign    Days of Exercise per Week: 3 days    Minutes of Exercise per Session: 30 min  Stress: No Stress Concern Present (01/24/2023)   [employer] of Occupational Health - Occupational Stress Questionnaire    Feeling of Stress : Not at all  Social Connections: Moderately Isolated (01/24/2023)   Social Connection and Isolation Panel [NHANES]    Frequency of Communication with Friends and Family: More than three times a week    Frequency of Social Gatherings with Friends and Family: More than three times a week    Attends Religious Services: More than 4 times per year    Active Member of [employer] or Organizations: No    Attends Banker Meetings: Never    Marital Status: Divorced  Catering manager Violence: Not At Risk (01/24/2023)   Humiliation, Afraid, Rape, and Kick questionnaire    Fear of Current or Ex-Partner: No    Emotionally Abused: No    Physically Abused: No  Sexually Abused: No    Outpatient Encounter Medications as of 07/25/2023  Medication Sig   aspirin EC 81 MG tablet Take 1 tablet (81 mg total) by mouth daily. Swallow whole.   atorvastatin (LIPITOR) 40 MG tablet TAKE 1 TABLET BY MOUTH AT BEDTIME   Blood Glucose Monitoring Suppl (ACCU-CHEK GUIDE ME) w/Device KIT Use in the morning, at noon, and at bedtime.   clopidogrel (PLAVIX) 75 MG tablet Take 1 tablet (75 mg total) by mouth daily.   dapagliflozin propanediol (FARXIGA) 10 MG TABS tablet Take 1 tablet (10 mg total) by mouth daily before breakfast.   docusate sodium (COLACE) 100 MG capsule Take 1 capsule (100 mg total) by mouth 2 (two) times daily.   glipiZIDE (GLUCOTROL) 5 MG tablet Take 1/2 tablet (2.5 mg total) by mouth 2 (two) times daily.   glucose blood (PIP BLOOD GLUCOSE TEST STRIP) test strip Use as instructed   losartan (COZAAR) 25 MG tablet TAKE 1 TABLET BY MOUTH DAILY   metFORMIN  (GLUCOPHAGE) 1000 MG tablet Take 1 tablet (1,000 mg total) by mouth 2 (two) times daily with a meal.   No facility-administered encounter medications on file as of 07/25/2023.    No Known Allergies  Review of Systems As per HPI  Objective:  BP 128/71   Pulse 98   Temp 98.5 F (36.9 C)   Ht 5\' 7"  (1.702 m)   Wt 196 lb 9.6 oz (89.2 kg)   SpO2 98%   BMI 30.79 kg/m    Wt Readings from Last 3 Encounters:  07/25/23 196 lb 9.6 oz (89.2 kg)  07/09/23 198 lb 3.2 oz (89.9 kg)  04/08/23 196 lb (88.9 kg)   Physical Exam Constitutional:      General: He is awake. He is not in acute distress.    Appearance: Normal appearance. He is well-developed and well-groomed. He is not ill-appearing, toxic-appearing or diaphoretic.  Cardiovascular:     Rate and Rhythm: Normal rate and regular rhythm.     Pulses: Normal pulses.          Radial pulses are 2+ on the right side and 2+ on the left side.       Posterior tibial pulses are 2+ on the right side and 2+ on the left side.     Heart sounds: Normal heart sounds. No murmur heard.    No gallop.  Pulmonary:     Effort: Pulmonary effort is normal. No respiratory distress.     Breath sounds: Normal breath sounds. No stridor. No wheezing, rhonchi or rales.  Musculoskeletal:     Cervical back: Full passive range of motion without pain and neck supple.     Right lower leg: No edema.     Left lower leg: No edema.  Skin:    General: Skin is warm.     Capillary Refill: Capillary refill takes less than 2 seconds.  Neurological:     General: No focal deficit present.     Mental Status: He is alert, oriented to person, place, and time and easily aroused. Mental status is at baseline.     GCS: GCS eye subscore is 4. GCS verbal subscore is 5. GCS motor subscore is 6.     Motor: No weakness.  Psychiatric:        Attention and Perception: Attention and perception normal.        Mood and Affect: Mood and affect normal.        Speech: Speech normal.  Behavior: Behavior normal. Behavior is cooperative.        Thought Content: Thought content normal. Thought content does not include homicidal or suicidal ideation. Thought content does not include homicidal or suicidal plan.        Cognition and Memory: Cognition and memory normal.        Judgment: Judgment normal.     Results for orders placed or performed in visit on 07/09/23  VAS [place] ABI WITH/WO TBI   Collection Time: 07/09/23  2:33 PM  Result Value Ref Range   Right ABI 0.57    Left ABI 0.78        07/25/2023    2:43 PM 01/24/2023    9:22 AM 12/06/2022   10:40 AM  Depression screen PHQ 2/9  Decreased Interest 0 0 0  Down, Depressed, Hopeless 0 0 0  PHQ - 2 Score 0 0 0  Altered sleeping  0 0  Tired, decreased energy  0 0  Change in appetite  0 0  Feeling bad or failure about yourself   0 0  Trouble concentrating  0 0  Moving slowly or fidgety/restless  0 0  Suicidal thoughts  0 0  PHQ-9 Score  0 0  Difficult doing work/chores  Not difficult at all Not difficult at all       07/25/2023    2:44 PM 12/06/2022   10:40 AM  GAD 7 : Generalized Anxiety Score  Nervous, Anxious, on Edge 0 0  Control/stop worrying 0 0  Worry too much - different things 0 0  Trouble relaxing 0 0  Restless 0 0  Easily annoyed or irritable 0 0  Afraid - awful might happen 0 0  Total GAD 7 Score 0 0  Anxiety Difficulty  Not difficult at all   Pertinent labs & imaging results that were available during my care of the patient were reviewed by me and considered in my medical decision making.  Assessment & Plan:  Evan Nelson was seen today for medical management of chronic issues.  Diagnoses and all orders for this visit:  1. Type 2 diabetes mellitus with other specified complication, without long-term current use of insulin (HCC) (Primary) Well controlled. Continue current regimen.  Provided patient with Marcelline Deist sample in office today as he is struggling to afford medications. Will coordinate  with pharmacist to determine if there is financial assistance.  - Bayer DCA Hb A1c Waived  2. Hyperlipidemia associated with type 2 diabetes mellitus (HCC) Will complete labs at follow up. Patient to continue to follow with Cardiology, Vein and Vascular and podiatry. Will consider increasing statin to max dose given ASCVD risk score.   3. Microalbuminuria Labs as below. Will communicate results to patient once available. Will await results to determine next steps.  Continue medications.  - Microalbumin / creatinine urine ratio  4. Peripheral arterial disease (HCC) Reviewed notes from [place], [place] on 07/09/23. Patient to continue to follow with specialty.     Continue all other maintenance medications.  Follow up plan: Return in about 6 months (around 01/23/2024) for Chronic Condition Follow up.  Continue healthy lifestyle choices, including diet (rich in fruits, vegetables, and lean proteins, and low in salt and simple carbohydrates) and exercise (at least 30 minutes of moderate physical activity daily).  Written and verbal instructions provided   The above assessment and management plan was discussed with the patient. The patient verbalized understanding of and has agreed to the management plan. Patient is aware to call the  clinic if they develop any new symptoms or if symptoms persist or worsen. Patient is aware when to return to the clinic for a follow-up visit. Patient educated on when it is appropriate to go to the emergency department.   Neale Burly, DNP-FNP [facility] [address] [place], [place] [address] [phone]

## 2023-07-26 LAB — MICROALBUMIN / CREATININE URINE RATIO
Creatinine, Urine: 93 mg/dL
Microalb/Creat Ratio: 158 mg/g{creat} — ABNORMAL HIGH (ref 0–29)
Microalbumin, Urine: 147.1 ug/mL

## 2023-07-26 NOTE — Addendum Note (Signed)
Addended by: Neale Burly on: 07/26/2023 02:03 PM   Modules accepted: Orders

## 2023-07-26 NOTE — Progress Notes (Signed)
Microalbumin remains elevated, will refer to nephrology for further evaluation.

## 2023-08-01 ENCOUNTER — Telehealth: Payer: Self-pay | Admitting: Family Medicine

## 2023-08-01 ENCOUNTER — Encounter: Payer: Self-pay | Admitting: Family Medicine

## 2023-08-06 ENCOUNTER — Telehealth: Payer: Self-pay

## 2023-08-06 NOTE — Telephone Encounter (Signed)
 Patient aware and verbalized understanding.

## 2023-08-06 NOTE — Telephone Encounter (Signed)
 Copied from CRM [id number]. Topic: General - Other >> Aug 06, 2023  9:35 AM Elle L wrote: Reason for CRM: The patient is following up regarding his fax for Farxiga  and to see if there are samples in the office yet as he only has one pill left. He would like someone to go over his lab results with him as well and the reason for his referral to Nephrology. His call back number is [phone].

## 2023-08-08 ENCOUNTER — Telehealth: Payer: Self-pay

## 2023-08-08 ENCOUNTER — Ambulatory Visit: Payer: Self-pay | Admitting: Family Medicine

## 2023-08-08 NOTE — Telephone Encounter (Signed)
 Returned pt's call about diabetic shoes, he did not answer and I left a vm .

## 2023-08-08 NOTE — Telephone Encounter (Signed)
 Pt calling to check on status of Farxiga. States that he dropped off paperwork on 08/01/2023.

## 2023-08-08 NOTE — Telephone Encounter (Signed)
 Copied from CRM [id number]. Topic: Clinical - Prescription Issue >> Aug 08, 2023 10:44 AM Montie POUR wrote: Reason for CRM: Danile states that he took a form to the office on January 2 that needed to be faxed to [facility] for approval. The medications is dapagliflozin  propanediol (FARXIGA ) 10 MG TABS tablet. When [employer] receives form, they will send approval to [facility]. Please call Ca[place] when this is faxed. He cannot get his medication without this fax. His number is [phone].

## 2023-08-12 NOTE — Progress Notes (Signed)
 Pharmacy Medication Assistance Program Note    08/12/2023  Patient ID: Evan Nelson, male   DOB: November 02, 1960, 63 y.o.   MRN: [id number]     08/12/2023  Outreach Medication One  Manufacturer Medication One [employer]  [employer] [facility] Farxiga   Type of Radiographer, Therapeutic Assistance  Date Application Submitted to Manufacturer 08/12/2023  Method Application Sent to [employer]

## 2023-08-13 MED ORDER — DAPAGLIFLOZIN PROPANEDIOL 10 MG PO TABS: 10.0000 mg | ORAL_TABLET | Freq: Every day | ORAL | 4 refills | Status: DC

## 2023-08-13 NOTE — Telephone Encounter (Signed)
Escribed farxiga to [employer]

## 2023-08-13 NOTE — Addendum Note (Signed)
 Addended by: Vanice Sarah D on: 08/13/2023 12:54 PM   Modules accepted: Orders

## 2023-08-17 DIAGNOSIS — N181 Chronic kidney disease, stage 1: Secondary | ICD-10-CM | POA: Diagnosis not present

## 2023-08-17 DIAGNOSIS — R809 Proteinuria, unspecified: Secondary | ICD-10-CM | POA: Diagnosis not present

## 2023-08-17 DIAGNOSIS — I739 Peripheral vascular disease, unspecified: Secondary | ICD-10-CM | POA: Diagnosis not present

## 2023-08-17 DIAGNOSIS — E1122 Type 2 diabetes mellitus with diabetic chronic kidney disease: Secondary | ICD-10-CM | POA: Diagnosis not present

## 2023-08-20 DIAGNOSIS — H5203 Hypermetropia, bilateral: Secondary | ICD-10-CM | POA: Diagnosis not present

## 2023-08-20 DIAGNOSIS — H2513 Age-related nuclear cataract, bilateral: Secondary | ICD-10-CM | POA: Diagnosis not present

## 2023-08-20 DIAGNOSIS — E119 Type 2 diabetes mellitus without complications: Secondary | ICD-10-CM | POA: Diagnosis not present

## 2023-08-20 DIAGNOSIS — H524 Presbyopia: Secondary | ICD-10-CM | POA: Diagnosis not present

## 2023-08-24 LAB — HM DIABETES EYE EXAM

## 2023-08-30 ENCOUNTER — Other Ambulatory Visit (HOSPITAL_COMMUNITY): Payer: Self-pay | Admitting: Nephrology

## 2023-08-30 DIAGNOSIS — E1169 Type 2 diabetes mellitus with other specified complication: Secondary | ICD-10-CM

## 2023-08-30 DIAGNOSIS — R809 Proteinuria, unspecified: Secondary | ICD-10-CM

## 2023-08-30 DIAGNOSIS — N181 Chronic kidney disease, stage 1: Secondary | ICD-10-CM

## 2023-09-09 ENCOUNTER — Ambulatory Visit (HOSPITAL_COMMUNITY): Payer: Medicare HMO

## 2023-09-09 ENCOUNTER — Encounter (HOSPITAL_COMMUNITY): Payer: Self-pay

## 2023-09-19 ENCOUNTER — Other Ambulatory Visit: Payer: Self-pay | Admitting: Family Medicine

## 2023-09-19 DIAGNOSIS — E1151 Type 2 diabetes mellitus with diabetic peripheral angiopathy without gangrene: Secondary | ICD-10-CM

## 2023-10-14 ENCOUNTER — Ambulatory Visit (INDEPENDENT_AMBULATORY_CARE_PROVIDER_SITE_OTHER): Payer: Medicare HMO | Admitting: Podiatry

## 2023-10-14 ENCOUNTER — Encounter: Payer: Self-pay | Admitting: Podiatry

## 2023-10-14 DIAGNOSIS — B351 Tinea unguium: Secondary | ICD-10-CM | POA: Diagnosis not present

## 2023-10-14 DIAGNOSIS — M79674 Pain in right toe(s): Secondary | ICD-10-CM | POA: Diagnosis not present

## 2023-10-14 DIAGNOSIS — E1169 Type 2 diabetes mellitus with other specified complication: Secondary | ICD-10-CM

## 2023-10-14 DIAGNOSIS — M79675 Pain in left toe(s): Secondary | ICD-10-CM

## 2023-10-14 DIAGNOSIS — I739 Peripheral vascular disease, unspecified: Secondary | ICD-10-CM | POA: Diagnosis not present

## 2023-10-14 NOTE — Progress Notes (Signed)
 Subjective:  Patient ID: Evan Nelson, male    DOB: 1961-04-21,  MRN: [id number]  No chief complaint on file.   63 y.o. male returns for concern of thickened elongated and painful nails that are difficult to trim. Requesting to have them trimmed today. Relates burning and tingling in their feet. Patient is diabetic and last A1c was  Lab Results  Component Value Date   HGBA1C 6.2 (H) 07/25/2023   .   PCP:  Arrie Senate, FNP   .   Review of Systems: Negative except as noted in the HPI. Denies N/V/F/Ch.  Past Medical History:  Diagnosis Date   Hypertension    Peripheral arterial disease (HCC)    Type 2 diabetes mellitus with other specified complication (HCC)     Current Outpatient Medications:    aspirin EC 81 MG tablet, Take 1 tablet (81 mg total) by mouth daily. Swallow whole., Disp: 30 tablet, Rfl: 12   atorvastatin (LIPITOR) 40 MG tablet, TAKE 1 TABLET BY MOUTH AT BEDTIME, Disp: 90 tablet, Rfl: 0   Blood Glucose Monitoring Suppl (ACCU-CHEK GUIDE ME) w/Device KIT, Use in the morning, at noon, and at bedtime., Disp: 1 kit, Rfl: 0   clopidogrel (PLAVIX) 75 MG tablet, Take 1 tablet (75 mg total) by mouth daily., Disp: 30 tablet, Rfl: 11   dapagliflozin propanediol (FARXIGA) 10 MG TABS tablet, Take 1 tablet (10 mg total) by mouth daily before breakfast., Disp: 90 tablet, Rfl: 4   docusate sodium (COLACE) 100 MG capsule, Take 1 capsule (100 mg total) by mouth 2 (two) times daily., Disp: 10 capsule, Rfl: 0   glipiZIDE (GLUCOTROL) 5 MG tablet, Take 1/2 tablet (2.5 mg total) by mouth 2 (two) times daily., Disp: 30 tablet, Rfl: 11   glucose blood (PIP BLOOD GLUCOSE TEST STRIP) test strip, Use as instructed, Disp: 100 each, Rfl: 12   losartan (COZAAR) 25 MG tablet, TAKE 1 TABLET BY MOUTH DAILY, Disp: 90 tablet, Rfl: 0   metFORMIN (GLUCOPHAGE) 1000 MG tablet, Take 1 tablet (1,000 mg total) by mouth 2 (two) times daily with a meal., Disp: 180 tablet, Rfl: 3  Social History    Tobacco Use  Smoking Status Every Day   Types: Cigarettes  Smokeless Tobacco Never    No Known Allergies Objective:  There were no vitals filed for this visit. There is no height or weight on file to calculate BMI. Vascular: DP/PT pulses 2/4 bilateral. CFT <3 seconds. Absent hair growth on digits. Edema noted to bilateral lower extremities. Xerosis noted bilaterally.  Skin. No lacerations or abrasions bilateral feet. Nails 1-4 bilateral and right fifth l  are thickened discolored and elongated with subungual debris.  Musculoskeletal: MMT 5/5 bilateral lower extremities in DF, PF, Inversion and Eversion. Deceased ROM in DF of ankle joint. Amputation of left fifth ray.  Neurological: Sensation intact to light touch. Protective sensation diminished bilateral.    Radiographs: Interval resection of left fifth ray.  Assessment:   1. Pain due to onychomycosis of toenails of both feet   2. Type 2 diabetes mellitus with other specified complication, without long-term current use of insulin (HCC)   3. Peripheral vascular disease (HCC)            Plan:   -Discussed if any worsening redness, pain, fever or chills to call or may need to report to the emergency room. Patient expressed understanding.  -Discussed and educated patient on diabetic foot care, especially with  regards to the vascular, neurological and musculoskeletal  systems.  -Stressed the importance of good glycemic control and the detriment of not  controlling glucose levels in relation to the foot. -Discussed supportive shoes at all times and checking feet regularly.  -Mechanically debrided all nails 1-5 bilateral using sterile nail nipper and filed with dremel without incident  -Answered all patient questions -Patient to return  in 3 months for at risk foot care -Patient advised to call the office if any problems or questions arise in the meantime.   No follow-ups on file.    No follow-ups on file.

## 2023-10-29 ENCOUNTER — Other Ambulatory Visit: Payer: Self-pay | Admitting: Family Medicine

## 2023-10-29 DIAGNOSIS — E1169 Type 2 diabetes mellitus with other specified complication: Secondary | ICD-10-CM

## 2023-10-31 ENCOUNTER — Telehealth: Payer: Self-pay

## 2023-10-31 NOTE — Telephone Encounter (Signed)
Samples placed up front.  Patient notified.

## 2023-10-31 NOTE — Telephone Encounter (Signed)
 Copied from CRM [id number]. Topic: Clinical - Medication Question >> Oct 31, 2023  1:54 PM Clayton Bibles wrote: Reason for CRM:  Evan Nelson would like to know if we have samples of dapagliflozin propanediol (FARXIGA) 10 MG TABS; His wife passed away and he was not able to get his medicine.  Please call him at [phone]. Thanks >> Oct 31, 2023  1:58 PM Clayton Bibles wrote: He is out of the medication. Wife did not have insurance and he had to pay for the funeral.

## 2023-11-15 NOTE — Progress Notes (Addendum)
 Pharmacy Medication Assistance Program Note    11/15/2023  Patient ID: Evan Nelson, male   DOB: 04/23/1961, 62 y.o.   MRN: [id number]     08/12/2023  Outreach [employer]  Manufacturer Medication One Nurse, adult Drugs Farxiga   Type of Radiographer, therapeutic Assistance  Date Application Submitted to Manufacturer 08/12/2023  Method Application Sent to Manufacturer Fax  Patient Assistance Determination Denied    Denied 08/12/23  Denied based on insurance coverage. Cannot have commercial or government coverage. Can submit addt'l documentation within 180 days with a letter attached.

## 2023-12-11 ENCOUNTER — Telehealth: Payer: Self-pay

## 2023-12-11 NOTE — Telephone Encounter (Signed)
 Copied from CRM [id number]. Topic: Clinical - Medication Question >> Dec 11, 2023 11:32 AM Donald Frost wrote: Reason for CRM: The patient called in inquiring if the office had any samples of his dapagliflozin  propanediol (FARXIGA ) 10 MG TABS tablet. Chelsea says she will have a nurse check into it and call the patient back.

## 2023-12-11 NOTE — Telephone Encounter (Signed)
 Informed pt we do not have any samples at this time. Pt verbalized understanding

## 2023-12-12 ENCOUNTER — Other Ambulatory Visit: Payer: Self-pay | Admitting: Family Medicine

## 2023-12-12 DIAGNOSIS — E1151 Type 2 diabetes mellitus with diabetic peripheral angiopathy without gangrene: Secondary | ICD-10-CM

## 2023-12-16 ENCOUNTER — Telehealth: Payer: Self-pay

## 2023-12-16 NOTE — Telephone Encounter (Signed)
 Copied from CRM [id number]. Topic: Clinical - Medical Advice >> Dec 16, 2023  8:58 AM Shelby Dessert H wrote: Reason for CRM: The patient called in inquiring if the office had any samples of his dapagliflozin  propanediol (FARXIGA ) 10 MG TABS tablet. Patients callback number is [phone].

## 2023-12-16 NOTE — Telephone Encounter (Signed)
 Called to inform patient that we do have samples of Farxiga  and that I have put some at the front for him to pick up.

## 2023-12-17 ENCOUNTER — Other Ambulatory Visit (HOSPITAL_COMMUNITY): Payer: Self-pay

## 2024-01-10 NOTE — Progress Notes (Unsigned)
 Office Note     CC:  follow up Requesting Provider:  Chrystine Crate*  HPI: Evan Nelson is a 63 y.o. (May 08, 1961) male who presents for surveillance of PAD.  He has history of left SFA angioplasty and stenting as well as angioplasty of the AT and PTA by Dr. Rosalva Comber on 09/10/22 due to nonhealing toe wound.  Subsequently he underwent 1st and 5th toe amputation with podiatry.  He underwent repeat angiography in July 2024 with left common iliac artery stenting as well as laser atherectomy and balloon angioplasty of the posterior tibial artery by Dr. Charlotte Cookey.  He denies any claudication, rest pain, or tissue loss bilateral lower extremities.  He is on aspirin , Plavix , statin daily.  He denies tobacco use.   Past Medical History:  Diagnosis Date   Hypertension    Peripheral arterial disease (HCC)    Type 2 diabetes mellitus with other specified complication [facility])     Past Surgical History:  Procedure Laterality Date   ABDOMINAL AORTOGRAM W/LOWER EXTREMITY N/A 09/10/2022   Procedure: ABDOMINAL AORTOGRAM W/LOWER EXTREMITY;  Surgeon: Kayla Part, MD;  Location: [facility] INVASIVE CV LAB;  Service: Cardiovascular;  Laterality: N/A;   ABDOMINAL AORTOGRAM W/LOWER EXTREMITY N/A 02/12/2023   Procedure: ABDOMINAL AORTOGRAM W/LOWER EXTREMITY;  Surgeon: Margherita Shell, MD;  Location: [facility] INVASIVE CV LAB;  Service: Cardiovascular;  Laterality: N/A;   AMPUTATION TOE Left 09/11/2022   Procedure: AMPUTATION LEFT FIFTH TOE;  Surgeon: Jennefer Moats, DPM;  Location: [facility] OR;  Service: Podiatry;  Laterality: Left;   PERIPHERAL VASCULAR ATHERECTOMY  02/12/2023   Procedure: PERIPHERAL VASCULAR ATHERECTOMY;  Surgeon: Margherita Shell, MD;  Location: [facility] INVASIVE CV LAB;  Service: Cardiovascular;;  Lt PT   PERIPHERAL VASCULAR BALLOON ANGIOPLASTY Left 09/10/2022   Procedure: PERIPHERAL VASCULAR BALLOON ANGIOPLASTY;  Surgeon: Kayla Part, MD;  Location[facility] INVASIVE CV LAB;  Service: Cardiovascular;   Laterality: Left;  Tibials   PERIPHERAL VASCULAR BALLOON ANGIOPLASTY  02/12/2023   Procedure: PERIPHERAL VASCULAR BALLOON ANGIOPLASTY;  Surgeon: Margherita Shell, MD;  Location: [facility] INVASIVE CV LAB;  Service: Cardiovascular;;  Lt PT   PERIPHERAL VASCULAR INTERVENTION Left 09/10/2022   Procedure: PERIPHERAL VASCULAR INTERVENTION;  Surgeon: Kayla Part, MD;  Location: [facility] INVASIVE CV LAB;  Service: Cardiovascular;  Laterality: Left;  SFA   PERIPHERAL VASCULAR INTERVENTION Left 02/12/2023   Procedure: PERIPHERAL VASCULAR INTERVENTION;  Surgeon: Margherita Shell, MD;  Location: [facility] INVASIVE CV LAB;  Service: Cardiovascular;  Laterality: Left;  LCIA    Social History   Socioeconomic History   Marital status: Divorced    Spouse name: Not on file   Number of children: Not on file   Years of education: Not on file   Highest education level: Not on file  Occupational History   Not on file  Tobacco Use   Smoking status: Every Day    Types: Cigarettes   Smokeless tobacco: Never  Vaping Use   Vaping status: Never Used  Substance and Sexual Activity   Alcohol use: Not Currently   Drug use: Not on file   Sexual activity: Not on file  Other Topics Concern   Not on file  Social History Narrative   Not on file   Social Drivers of Health   Financial Resource Strain: Low Risk  (01/24/2023)   Overall Financial Resource Strain (CARDIA)    Difficulty of Paying Living Expenses: Not hard at all  Food Insecurity: No Food Insecurity (01/24/2023)   Hunger Vital  Sign    Worried About Programme researcher, broadcasting/film/video in the Last Year: Never true    Ran Out of Food in the Last Year: Never true  Transportation Needs: No Transportation Needs (01/24/2023)   PRAPARE - Administrator, Civil Service (Medical): No    Lack of Transportation (Non-Medical): No  Physical Activity: Insufficiently Active (01/24/2023)   Exercise Vital Sign    Days of Exercise per Week: 3 days    Minutes of Exercise per Session: 30  min  Stress: No Stress Concern Present (01/24/2023)   [employer] of Occupational Health - Occupational Stress Questionnaire    Feeling of Stress : Not at all  Social Connections: Moderately Isolated (01/24/2023)   Social Connection and Isolation Panel    Frequency of Communication with Friends and Family: More than three times a week    Frequency of Social Gatherings with Friends and Family: More than three times a week    Attends Religious Services: More than 4 times per year    Active Member of [employer] or Organizations: No    Attends Banker Meetings: Never    Marital Status: Divorced  Catering manager Violence: Not At Risk (01/24/2023)   Humiliation, Afraid, Rape, and Kick questionnaire    Fear of Current or Ex-Partner: No    Emotionally Abused: No    Physically Abused: No    Sexually Abused: No   History reviewed. No pertinent family history.  Current Outpatient Medications  Medication Sig Dispense Refill   ASPIRIN  LOW DOSE 81 MG tablet TAKE 1 TABLET BY MOUTH DAILY. swallow whole 30 tablet 12   atorvastatin  (LIPITOR) 40 MG tablet TAKE 1 TABLET BY MOUTH AT BEDTIME 90 tablet 0   Blood Glucose Monitoring Suppl (ACCU-CHEK GUIDE ME) w/Device KIT Use in the morning, at noon, and at bedtime. 1 kit 0   clopidogrel  (PLAVIX ) 75 MG tablet Take 1 tablet (75 mg total) by mouth daily. 30 tablet 11   dapagliflozin  propanediol (FARXIGA ) 10 MG TABS tablet Take 1 tablet (10 mg total) by mouth daily before breakfast. 90 tablet 4   docusate sodium  (COLACE) 100 MG capsule Take 1 capsule (100 mg total) by mouth 2 (two) times daily. 10 capsule 0   glipiZIDE  (GLUCOTROL ) 5 MG tablet Take 1/2 tablet (2.5 mg total) by mouth 2 (two) times daily. 30 tablet 11   glucose blood (PIP BLOOD GLUCOSE TEST STRIP) test strip Use as instructed 100 each 12   losartan  (COZAAR ) 25 MG tablet TAKE 1 TABLET BY MOUTH DAILY 90 tablet 0   metFORMIN  (GLUCOPHAGE ) 1000 MG tablet TAKE 1 TABLET BY MOUTH TWICE DAILY  WITH A MEAL 180 tablet 0   No current facility-administered medications for this visit.    No Known Allergies   REVIEW OF SYSTEMS:   [X]  denotes positive finding, [ ]  denotes negative finding Cardiac  Comments:  Chest pain or chest pressure:    Shortness of breath upon exertion:    Short of breath when lying flat:    Irregular heart rhythm:        Vascular    Pain in calf, thigh, or hip brought on by ambulation:    Pain in feet at night that wakes you up from your sleep:     Blood clot in your veins:    Leg swelling:         Pulmonary    Oxygen at home:    Productive cough:     Wheezing:  Neurologic    Sudden weakness in arms or legs:     Sudden numbness in arms or legs:     Sudden onset of difficulty speaking or slurred speech:    Temporary loss of vision in one eye:     Problems with dizziness:         Gastrointestinal    Blood in stool:     Vomited blood:         Genitourinary    Burning when urinating:     Blood in urine:        Psychiatric    Major depression:         Hematologic    Bleeding problems:    Problems with blood clotting too easily:        Skin    Rashes or ulcers:        Constitutional    Fever or chills:      PHYSICAL EXAMINATION:  Vitals:   01/14/24 1034  BP: (!) 144/80  Pulse: 90  SpO2: 99%  Weight: 196 lb (88.9 kg)  Height: 5' 7 (1.702 m)    General:  WDWN in NAD; vital signs documented above Gait: Not observed HENT: WNL, normocephalic Pulmonary: normal non-labored breathing Cardiac: regular HR Abdomen: soft, NT, no masses Skin: without rashes Vascular Exam/Pulses: palpable L PT pulse Extremities: without ischemic changes, without Gangrene , without cellulitis; without open wounds;  Musculoskeletal: no muscle wasting or atrophy  Neurologic: A&O X 3 Psychiatric:  The pt has Normal affect.   Non-Invasive Vascular Imaging:   Aortoiliac duplex without any flow-limiting stenosis  Left SFA stent widely  patent  ABI/TBIToday's ABIToday's TBIPrevious ABIPrevious TBI  +-------+-----------+-----------+------------+------------+  Right 0.68       0.28       0.57        0.26          +-------+-----------+-----------+------------+------------+  Left  0.88       0.72       0.78        0.64             ASSESSMENT/PLAN:: 63 y.o. male here for follow up for surveillance of PAD  Left leg well-perfused with a palpable PT pulse Aortoiliac duplex history of flow-limiting stenosis; left SFA stent is widely patent by duplex Continue aspirin , Plavix , statin Continue to walk for exercise on a regular basis Repeat imaging studies in 1 year.  Call/return office sooner with any questions or concerns   Cordie Deters, PA-C [facility] [phone]

## 2024-01-12 ENCOUNTER — Other Ambulatory Visit: Payer: Self-pay | Admitting: Family Medicine

## 2024-01-12 DIAGNOSIS — E1151 Type 2 diabetes mellitus with diabetic peripheral angiopathy without gangrene: Secondary | ICD-10-CM

## 2024-01-13 ENCOUNTER — Telehealth: Payer: Self-pay | Admitting: Family Medicine

## 2024-01-13 NOTE — Telephone Encounter (Signed)
Samples up front patient aware  

## 2024-01-13 NOTE — Telephone Encounter (Signed)
 Copied from CRM [id number]. Topic: Clinical - Prescription Issue >> Jan 13, 2024  3:20 PM Fredrica W wrote: Reason for CRM: Patient called. States he is able to get dapagliflozin  propanediol (FARXIGA ) 10 MG TABS tablet. States copay is $500 and they are requiring him to pay 250 which he will not have until he gets paid and he is out of medication. Checking to see what provider thinks he should do or if samples are available. Thank You

## 2024-01-14 ENCOUNTER — Ambulatory Visit: Payer: Medicare HMO

## 2024-01-14 ENCOUNTER — Ambulatory Visit

## 2024-01-14 ENCOUNTER — Ambulatory Visit (INDEPENDENT_AMBULATORY_CARE_PROVIDER_SITE_OTHER): Admitting: Physician Assistant

## 2024-01-14 VITALS — BP 144/80 | HR 90 | Ht 67.0 in | Wt 196.0 lb

## 2024-01-14 DIAGNOSIS — I739 Peripheral vascular disease, unspecified: Secondary | ICD-10-CM

## 2024-01-14 DIAGNOSIS — I7025 Atherosclerosis of native arteries of other extremities with ulceration: Secondary | ICD-10-CM

## 2024-01-14 LAB — VAS US ABI WITH/WO TBI
Left ABI: 0.88
Right ABI: 0.68

## 2024-01-20 ENCOUNTER — Ambulatory Visit (INDEPENDENT_AMBULATORY_CARE_PROVIDER_SITE_OTHER): Admitting: Podiatry

## 2024-01-20 DIAGNOSIS — Z91199 Patient's noncompliance with other medical treatment and regimen due to unspecified reason: Secondary | ICD-10-CM

## 2024-01-20 NOTE — Progress Notes (Signed)
 No show

## 2024-01-23 ENCOUNTER — Ambulatory Visit (INDEPENDENT_AMBULATORY_CARE_PROVIDER_SITE_OTHER): Payer: Medicare HMO | Admitting: Family Medicine

## 2024-01-23 ENCOUNTER — Encounter: Payer: Self-pay | Admitting: Family Medicine

## 2024-01-23 ENCOUNTER — Telehealth: Payer: Self-pay | Admitting: Pharmacist

## 2024-01-23 VITALS — BP 134/73 | HR 94 | Temp 98.9°F | Ht 67.0 in | Wt 203.0 lb

## 2024-01-23 DIAGNOSIS — E1169 Type 2 diabetes mellitus with other specified complication: Secondary | ICD-10-CM

## 2024-01-23 DIAGNOSIS — I739 Peripheral vascular disease, unspecified: Secondary | ICD-10-CM

## 2024-01-23 DIAGNOSIS — S98132A Complete traumatic amputation of one left lesser toe, initial encounter: Secondary | ICD-10-CM

## 2024-01-23 DIAGNOSIS — Z7984 Long term (current) use of oral hypoglycemic drugs: Secondary | ICD-10-CM

## 2024-01-23 DIAGNOSIS — E785 Hyperlipidemia, unspecified: Secondary | ICD-10-CM

## 2024-01-23 DIAGNOSIS — F4320 Adjustment disorder, unspecified: Secondary | ICD-10-CM

## 2024-01-23 LAB — BAYER DCA HB A1C WAIVED: HB A1C (BAYER DCA - WAIVED): 7.2 % — ABNORMAL HIGH (ref 4.8–5.6)

## 2024-01-23 MED ORDER — EMPAGLIFLOZIN 10 MG PO TABS: 20.0000 mg | ORAL_TABLET | Freq: Every day | ORAL | Status: DC

## 2024-01-23 NOTE — Telephone Encounter (Signed)
   Jardiance samples for T2DM given to patient today (10mg  = 2 tabs daily=20mg ) as a bridge to insurance in August.  Will discuss patient assistance and ensure patient has T2DM follow up with PharmD.  [id number] placed.  Counseling provided to patient on sick day rules and encouraged patient to stay hydrated.  Telia Amundson Dattero Falen Lehrmann, PharmD, BCACP, CPP Clinical Pharmacist, [facility]

## 2024-01-23 NOTE — Progress Notes (Addendum)
 Subjective:  Patient ID: Evan Nelson, male    DOB: [birth year], 63 y.o.   MRN: [id number]  Patient Care Team: Cathlene Marry Lenis, FNP as PCP - General (Family Medicine)   Chief Complaint:  chronic condition management  HPI: Evan Nelson is a 63 y.o. male presenting on 01/23/2024 for chronic condition management  HPI 1. Type 2 diabetes mellitus with other specified complication, without long-term current use of insulin  (HCC) High at home: 143 after meals; Low at home: 100, Taking medication(s): glipizide  and metformin   States that he switched insurance plans so that he can afford farxiga , but it will not take affect until August.  He is established with Rachele, MD in [place].   Last eye exam: 07/2023 Last foot exam: 02/2023 Last A1c:  Lab Results  Component Value Date   HGBA1C 6.2 (H) 07/25/2023   Nephropathy screen indicated?: following with bhutani, md  Taking losartan .  Last flu, zoster and/or pneumovax:  There is no immunization history on file for this patient.  ROS: Denies dizziness, LOC, polyuria, polydipsia, unintended weight loss/gain, numbness or tingling in extremities, shortness of breath or chest pain. States that foot has healed well. He continues to follow with vascular.   2. Peripheral arterial disease (HCC) Patient is established with [facility]. Last appt was 01/14/2024. Denies pain with activity. He is wearing compression stockings.   3. Hyperlipidemia associated with type 2 diabetes mellitus (HCC) Tolerating lipitor. Tries to walk.  States that he is watching what he eats.    4. Grief  Of note, patient lost his wife 3 months ago to pneumonia. His daughter is fighting breast cancer, 66 year old granddaughter diagnosed with bone cancer. States that he is trying to stay active, he is back at work. Denies thoughts of self harm. Does not wish to start counseling or medications at this time.   Relevant past  medical, surgical, family, and social history reviewed and updated as indicated.  Allergies and medications reviewed and updated. Data reviewed: Chart in Epic.   Past Medical History:  Diagnosis Date   Hypertension    Peripheral arterial disease (HCC)    Type 2 diabetes mellitus with other specified complication [facility])     Past Surgical History:  Procedure Laterality Date   ABDOMINAL AORTOGRAM W/LOWER EXTREMITY N/A 09/10/2022   Procedure: ABDOMINAL AORTOGRAM W/LOWER EXTREMITY;  Surgeon: Lanis Fonda BRAVO, MD;  Location: [facility] INVASIVE CV LAB;  Service: Cardiovascular;  Laterality: N/A;   ABDOMINAL AORTOGRAM W/LOWER EXTREMITY N/A 02/12/2023   Procedure: ABDOMINAL AORTOGRAM W/LOWER EXTREMITY;  Surgeon: Serene Gaile ORN, MD;  Location: [facility] INVASIVE CV LAB;  Service: Cardiovascular;  Laterality: N/A;   AMPUTATION TOE Left 09/11/2022   Procedure: AMPUTATION LEFT FIFTH TOE;  Surgeon: Joya Stabs, DPM;  Location: [facility] OR;  Service: Podiatry;  Laterality: Left;   PERIPHERAL VASCULAR ATHERECTOMY  02/12/2023   Procedure: PERIPHERAL VASCULAR ATHERECTOMY;  Surgeon: Serene Gaile ORN, MD;  Location: [facility] INVASIVE CV LAB;  Service: Cardiovascular;;  Lt PT   PERIPHERAL VASCULAR BALLOON ANGIOPLASTY Left 09/10/2022   Procedure: PERIPHERAL VASCULAR BALLOON ANGIOPLASTY;  Surgeon: Lanis Fonda BRAVO, MD;  Location: [facility] INVASIVE CV LAB;  Service: Cardiovascular;  Laterality: Left;  Tibials   PERIPHERAL VASCULAR BALLOON ANGIOPLASTY  02/12/2023   Procedure: PERIPHERAL VASCULAR BALLOON ANGIOPLASTY;  Surgeon: Serene Gaile ORN, MD;  Location: [facility] INVASIVE CV LAB;  Service: Cardiovascular;;  Lt PT   PERIPHERAL VASCULAR INTERVENTION Left 09/10/2022   Procedure: PERIPHERAL VASCULAR  INTERVENTION;  Surgeon: Lanis Fonda BRAVO, MD;  Location: [facility] INVASIVE CV LAB;  Service: Cardiovascular;  Laterality: Left;  SFA   PERIPHERAL VASCULAR INTERVENTION Left 02/12/2023   Procedure: PERIPHERAL VASCULAR INTERVENTION;  Surgeon: Serene Gaile ORN, MD;   Location: [facility] INVASIVE CV LAB;  Service: Cardiovascular;  Laterality: Left;  LCIA    Social History   Socioeconomic History   Marital status: Divorced    Spouse name: Not on file   Number of children: Not on file   Years of education: Not on file   Highest education level: Not on file  Occupational History   Not on file  Tobacco Use   Smoking status: Every Day    Types: Cigarettes   Smokeless tobacco: Never  Vaping Use   Vaping status: Never Used  Substance and Sexual Activity   Alcohol use: Not Currently   Drug use: Not on file   Sexual activity: Not on file  Other Topics Concern   Not on file  Social History Narrative   Not on file   Social Drivers of Health   Financial Resource Strain: Low Risk  (01/24/2023)   Overall Financial Resource Strain (CARDIA)    Difficulty of Paying Living Expenses: Not hard at all  Food Insecurity: No Food Insecurity (01/23/2024)   Hunger Vital Sign    Worried About Running Out of Food in the Last Year: Never true    Ran Out of Food in the Last Year: Never true  Transportation Needs: No Transportation Needs (01/23/2024)   PRAPARE - Administrator, Civil Service (Medical): No    Lack of Transportation (Non-Medical): No  Physical Activity: Insufficiently Active (01/24/2023)   Exercise Vital Sign    Days of Exercise per Week: 3 days    Minutes of Exercise per Session: 30 min  Stress: No Stress Concern Present (01/24/2023)   [employer] of Occupational Health - Occupational Stress Questionnaire    Feeling of Stress : Not at all  Social Connections: Moderately Isolated (01/24/2023)   Social Connection and Isolation Panel    Frequency of Communication with Friends and Family: More than three times a week    Frequency of Social Gatherings with Friends and Family: More than three times a week    Attends Religious Services: More than 4 times per year    Active Member of [employer] or Organizations: No    Attends Banker  Meetings: Never    Marital Status: Divorced  Catering manager Violence: Not At Risk (01/23/2024)   Humiliation, Afraid, Rape, and Kick questionnaire    Fear of Current or Ex-Partner: No    Emotionally Abused: No    Physically Abused: No    Sexually Abused: No    Outpatient Encounter Medications as of 01/23/2024  Medication Sig   ASPIRIN  LOW DOSE 81 MG tablet TAKE 1 TABLET BY MOUTH DAILY. swallow whole   atorvastatin  (LIPITOR) 40 MG tablet TAKE 1 TABLET BY MOUTH AT BEDTIME   Blood Glucose Monitoring Suppl (ACCU-CHEK GUIDE ME) w/Device KIT Use in the morning, at noon, and at bedtime.   clopidogrel  (PLAVIX ) 75 MG tablet Take 1 tablet (75 mg total) by mouth daily.   dapagliflozin  propanediol (FARXIGA ) 10 MG TABS tablet Take 1 tablet (10 mg total) by mouth daily before breakfast.   docusate sodium  (COLACE) 100 MG capsule Take 1 capsule (100 mg total) by mouth 2 (two) times daily.   glipiZIDE  (GLUCOTROL ) 5 MG tablet Take 1/2 tablet (2.5 mg total) by  mouth 2 (two) times daily.   glucose blood (PIP BLOOD GLUCOSE TEST STRIP) test strip Use as instructed   losartan  (COZAAR ) 25 MG tablet TAKE 1 TABLET BY MOUTH DAILY   metFORMIN  (GLUCOPHAGE ) 1000 MG tablet TAKE 1 TABLET BY MOUTH TWICE DAILY WITH A MEAL   No facility-administered encounter medications on file as of 01/23/2024.    No Known Allergies  Review of Systems As per HPI  Objective:  BP 134/73   Pulse 94   Temp 98.9 F (37.2 C)   Ht 5' 7 (1.702 m)   Wt 203 lb (92.1 kg)   SpO2 98%   BMI 31.79 kg/m    Wt Readings from Last 3 Encounters:  01/23/24 203 lb (92.1 kg)  01/14/24 196 lb (88.9 kg)  07/25/23 196 lb 9.6 oz (89.2 kg)   Physical Exam Constitutional:      General: He is awake. He is not in acute distress.    Appearance: Normal appearance. He is well-developed and well-groomed. He is obese. He is not ill-appearing, toxic-appearing or diaphoretic.   Cardiovascular:     Rate and Rhythm: Normal rate and regular rhythm.      Pulses: Normal pulses.          Radial pulses are 2+ on the right side and 2+ on the left side.       Posterior tibial pulses are 2+ on the right side and 2+ on the left side.     Heart sounds: Normal heart sounds. No murmur heard.    No gallop.  Pulmonary:     Effort: Pulmonary effort is normal. No respiratory distress.     Breath sounds: Normal breath sounds. No stridor. No wheezing, rhonchi or rales.   Musculoskeletal:     Cervical back: Full passive range of motion without pain and neck supple.     Right lower leg: No edema.     Left lower leg: No edema.   Skin:    General: Skin is warm.     Capillary Refill: Capillary refill takes less than 2 seconds.   Neurological:     General: No focal deficit present.     Mental Status: He is alert, oriented to person, place, and time and easily aroused. Mental status is at baseline.     GCS: GCS eye subscore is 4. GCS verbal subscore is 5. GCS motor subscore is 6.     Motor: No weakness.   Psychiatric:        Attention and Perception: Attention and perception normal.        Mood and Affect: Mood normal. Affect is tearful.        Speech: Speech normal.        Behavior: Behavior normal. Behavior is cooperative.        Thought Content: Thought content normal. Thought content does not include homicidal or suicidal ideation. Thought content does not include homicidal or suicidal plan.        Cognition and Memory: Cognition and memory normal.        Judgment: Judgment normal.     Results for orders placed or performed in visit on 01/14/24  VAS US  ABI WITH/WO TBI   Collection Time: 01/14/24 10:46 AM  Result Value Ref Range   Right ABI 0.68    Left ABI 0.88        01/23/2024    1:41 PM 07/25/2023    2:43 PM 01/24/2023    9:22 AM 12/06/2022   10:40 AM  Depression screen PHQ 2/9  Decreased Interest 0 0 0 0  Down, Depressed, Hopeless 0 0 0 0  PHQ - 2 Score 0 0 0 0  Altered sleeping 0  0 0  Tired, decreased energy 0  0 0  Change in  appetite 0  0 0  Feeling bad or failure about yourself  0  0 0  Trouble concentrating 0  0 0  Moving slowly or fidgety/restless 0  0 0  Suicidal thoughts 0  0 0  PHQ-9 Score 0  0 0  Difficult doing work/chores Not difficult at all  Not difficult at all Not difficult at all       01/23/2024    1:40 PM 07/25/2023    2:44 PM 12/06/2022   10:40 AM  GAD 7 : Generalized Anxiety Score  Nervous, Anxious, on Edge 0 0 0  Control/stop worrying 0 0 0  Worry too much - different things 0 0 0  Trouble relaxing 0 0 0  Restless 0 0 0  Easily annoyed or irritable 0 0 0  Afraid - awful might happen 0 0 0  Total GAD 7 Score 0 0 0  Anxiety Difficulty Not difficult at all  Not difficult at all   Pertinent labs & imaging results that were available during my care of the patient were reviewed by me and considered in my medical decision making.  Assessment & Plan:  Diagnoses and all orders for this visit:  1. Type 2 diabetes mellitus with other specified complication, without long-term current use of insulin  (HCC) (Primary) A1C has increased. Above goal.  Labs as below. Will communicate results to patient once available. Will await results to determine next steps.  Provided patient with sample of jardiance 10 mg. Instructed to take 20 mg per day. Discussed side effects with patient.  Needs to follow up with bhutani, MD for microalbumin  Will share chart with pharmacist for patient to follow up to pursue patient assistance or to continue jardiance samples until insurance takes effect.  - CMP14+EGFR - Bayer DCA Hb A1c Waived - CBC with Differential/Platelet - Lipid panel - Vitamin B12 - empagliflozin (JARDIANCE) 10 MG TABS tablet; Take 2 tablets (20 mg total) by mouth daily before breakfast.  2. Peripheral arterial disease (HCC) Established with vascular and vein. Reviewed notes from [place], [place]. Continue risk reduction.   3. Hyperlipidemia associated with type 2 diabetes mellitus (HCC) Continue  lipitor.   4. 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 Continue to follow with specialty.   5. Grief reaction Patient reports stable symptoms. Pt offered nonpharmacologic and pharmacologic therapy. Pt declined initiating treatment at this time. Denies intent to harm self or others. Pt to notify provider if they would like to initiate treatment.    Continue all other maintenance medications.  Follow up plan: Return in about 6 months (around 07/24/2024) for Chronic Condition Follow up.   Continue healthy lifestyle choices, including diet (rich in fruits, vegetables, and lean proteins, and low in salt and simple carbohydrates) and exercise (at least 30 minutes of moderate physical activity daily).  Written and verbal instructions provided   The above assessment and management plan was discussed with the patient. The patient verbalized understanding of and has agreed to the management plan. Patient is aware to call the clinic if they develop any new symptoms or if symptoms persist or worsen. Patient is aware when to return to the clinic for a follow-up visit. Patient educated on when it is  appropriate to go to the emergency department.   Marry Kins, DNP-FNP [facility] [address] [place], [place] [address] [phone]

## 2024-01-24 ENCOUNTER — Telehealth: Payer: Self-pay | Admitting: Pharmacist

## 2024-01-24 ENCOUNTER — Ambulatory Visit: Payer: Self-pay | Admitting: Family Medicine

## 2024-01-24 DIAGNOSIS — E1169 Type 2 diabetes mellitus with other specified complication: Secondary | ICD-10-CM

## 2024-01-24 LAB — CBC WITH DIFFERENTIAL/PLATELET
Basophils Absolute: 0 10*3/uL (ref 0.0–0.2)
Basos: 0 %
EOS (ABSOLUTE): 0.2 10*3/uL (ref 0.0–0.4)
Eos: 2 %
Hematocrit: 49 % (ref 37.5–51.0)
Hemoglobin: 16.4 g/dL (ref 13.0–17.7)
Immature Grans (Abs): 0.1 10*3/uL (ref 0.0–0.1)
Immature Granulocytes: 1 %
Lymphocytes Absolute: 3.3 10*3/uL — ABNORMAL HIGH (ref 0.7–3.1)
Lymphs: 44 %
MCH: 30.4 pg (ref 26.6–33.0)
MCHC: 33.5 g/dL (ref 31.5–35.7)
MCV: 91 fL (ref 79–97)
Monocytes Absolute: 0.6 10*3/uL (ref 0.1–0.9)
Monocytes: 8 %
Neutrophils Absolute: 3.4 10*3/uL (ref 1.4–7.0)
Neutrophils: 45 %
Platelets: 335 10*3/uL (ref 150–450)
RBC: 5.4 x10E6/uL (ref 4.14–5.80)
RDW: 15.9 % — ABNORMAL HIGH (ref 11.6–15.4)
WBC: 7.6 10*3/uL (ref 3.4–10.8)

## 2024-01-24 LAB — CMP14+EGFR
ALT: 22 IU/L (ref 0–44)
AST: 20 IU/L (ref 0–40)
Albumin: 4.3 g/dL (ref 3.9–4.9)
Alkaline Phosphatase: 57 IU/L (ref 44–121)
BUN/Creatinine Ratio: 12 (ref 10–24)
BUN: 12 mg/dL (ref 8–27)
Bilirubin Total: 0.3 mg/dL (ref 0.0–1.2)
CO2: 22 mmol/L (ref 20–29)
Calcium: 9.8 mg/dL (ref 8.6–10.2)
Chloride: 102 mmol/L (ref 96–106)
Creatinine, Ser: 1.03 mg/dL (ref 0.76–1.27)
Globulin, Total: 2.6 g/dL (ref 1.5–4.5)
Glucose: 70 mg/dL (ref 70–99)
Potassium: 4.2 mmol/L (ref 3.5–5.2)
Sodium: 140 mmol/L (ref 134–144)
Total Protein: 6.9 g/dL (ref 6.0–8.5)
eGFR: 81 mL/min/{1.73_m2} (ref >59)

## 2024-01-24 LAB — LIPID PANEL
Chol/HDL Ratio: 4 ratio (ref 0.0–5.0)
Cholesterol, Total: 136 mg/dL (ref 100–199)
HDL: 34 mg/dL — ABNORMAL LOW (ref >39)
LDL Chol Calc (NIH): 75 mg/dL (ref 0–99)
Triglycerides: 158 mg/dL — ABNORMAL HIGH (ref 0–149)
VLDL Cholesterol Cal: 27 mg/dL (ref 5–40)

## 2024-01-24 LAB — VITAMIN B12: Vitamin B-12: 540 pg/mL (ref 232–1245)

## 2024-01-24 NOTE — Progress Notes (Signed)
 Mild variations on CBC, not concerning at this time. The 10-year ASCVD risk score (Arnett DK, et al., 2019) is: 30.3%. Recommend continuing lipitor. If tolerating, can consider increasing to 80 mg daily. A1C above goal. Start jardiance . Follow up with julie, referral placed.

## 2024-01-24 NOTE — Telephone Encounter (Signed)
   Patient is flagging as [employer] now, but appears to have [employer] as well.  I believe he will be full Medicare in August.  Could we go ahead and pursue Farxiga  PAP or would we need to wait.  The decision to pursue Farxiga  PAP would be due to ease of AZ&me PAP vs BI cares.   We only had samples of Jardiance  10 mg tablets.  Jardiance  10mg  tabs given to patient with instructions to Take 2 tabs (20mg ) by mouth every morning.  He reports copay is too high on Jardiance  & Farxiga  at this time.  Patient needs increased control of T2DM and has been without medication due to cost.  I have follow up with PharmD scheduled for August 2025 to capture potential insurance barriers.  Keep me posted and route entire PAP to me if able.  Appreciate assistance  Mliss Tarry Griffin, PharmD, BCACP, CPP Clinical Pharmacist, [facility]

## 2024-01-27 ENCOUNTER — Ambulatory Visit: Payer: Medicare PPO

## 2024-01-27 ENCOUNTER — Encounter: Payer: Self-pay | Admitting: Family Medicine

## 2024-01-27 VITALS — BP 134/73 | HR 94 | Ht 67.0 in | Wt 203.0 lb

## 2024-01-27 DIAGNOSIS — Z5329 Procedure and treatment not carried out because of patient's decision for other reasons: Secondary | ICD-10-CM

## 2024-01-27 DIAGNOSIS — Z0000 Encounter for general adult medical examination without abnormal findings: Secondary | ICD-10-CM

## 2024-01-27 NOTE — Patient Instructions (Signed)
 Evan Nelson , Thank you for taking time out of your busy schedule to complete your Annual Wellness Visit with me. I enjoyed our conversation and look forward to speaking with you again next year. I, as well as your care team,  appreciate your ongoing commitment to your health goals. Please review the following plan we discussed and let me know if I can assist you in the future. Your Game plan/ To Do List    Follow up Visits: Next Medicare AWV with our clinical staff: 01/27/25 at 3:10p.m.   Have you seen your provider in the last 6 months (3 months if uncontrolled diabetes)? Yes Next Office Visit with your provider: 07/24/24 at 11:05a.m.  Clinician Recommendations:  Aim for 30 minutes of exercise or brisk walking, 6-8 glasses of water, and 5 servings of fruits and vegetables each day.       This is a list of the screening recommended for you and due dates:  Health Maintenance  Topic Date Due   Colon Cancer Screening  Never done   COVID-19 Vaccine (1 - 2024-25 season) 02/08/2024*   Zoster (Shingles) Vaccine (1 of 2) 04/24/2024*   DTaP/Tdap/Td vaccine (1 - Tdap) 07/24/2024*   Pneumococcal Vaccination (1 of 2 - PCV) 01/22/2025*   Hepatitis C Screening  01/22/2025*   Flu Shot  02/28/2024   Complete foot exam   03/11/2024   Yearly kidney health urinalysis for diabetes  07/24/2024   Hemoglobin A1C  07/24/2024   Eye exam for diabetics  08/23/2024   Yearly kidney function blood test for diabetes  01/22/2025   Medicare Annual Wellness Visit  01/26/2025   HIV Screening  Completed   Hepatitis B Vaccine  Aged Out   HPV Vaccine  Aged Out   Meningitis B Vaccine  Aged Out  *Topic was postponed. The date shown is not the original due date.    Advanced directives: (Declined) Advance directive discussed with you today. Even though you declined this today, please call our office should you change your mind, and we can give you the proper paperwork for you to fill out. Advance Care Planning is important  because it:  [x]  Makes sure you receive the medical care that is consistent with your values, goals, and preferences  [x]  It provides guidance to your family and loved ones and reduces their decisional burden about whether or not they are making the right decisions based on your wishes.  Follow the link provided in your after visit summary or read over the paperwork we have mailed to you to help you started getting your Advance Directives in place. If you need assistance in completing these, please reach out to us  so that we can help you!  See attachments for Preventive Care and Fall Prevention Tips.

## 2024-01-27 NOTE — Progress Notes (Addendum)
 Subjective:   Evan Nelson is a 63 y.o. who presents for a Medicare Wellness preventive visit.  As a reminder, Annual Wellness Visits don't include a physical exam, and some assessments may be limited, especially if this visit is performed virtually. We may recommend an in-person follow-up visit with your provider if needed.  Visit Complete: Virtual I connected with  Evan Nelson on 02/06/24 by a audio enabled telemedicine application and verified that I am speaking with the correct person using two identifiers.  Patient Location: Home  Provider Location: Home Office  I discussed the limitations of evaluation and management by telemedicine. The patient expressed understanding and agreed to proceed.  Vital Signs: Because this visit was a virtual/telehealth visit, some criteria may be missing or patient reported. Any vitals not documented were not able to be obtained and vitals that have been documented are patient reported.  VideoDeclined- This patient declined Librarian, academic. Therefore the visit was completed with audio only.  Persons Participating in Visit: Patient.  AWV Questionnaire: No: Patient Medicare AWV questionnaire was not completed prior to this visit.  Cardiac Risk Factors include: advanced age (>47men, >45 women);diabetes mellitus;dyslipidemia;male gender;obesity (BMI >30kg/m2)     Objective:    Today's Vitals   01/27/24 1503  BP: 134/73  Pulse: 94  Weight: 203 lb (92.1 kg)  Height: 5' 7 (1.702 m)   Body mass index is 31.79 kg/m.     01/27/2024    2:43 PM 02/12/2023    6:37 AM 01/24/2023    9:22 AM 10/08/2022    6:03 PM 09/11/2022    1:30 PM 09/08/2022   12:34 PM  Advanced Directives  Does Patient Have a Medical Advance Directive? No No No No No No  Would patient like information on creating a medical advance directive?  No - Patient declined No - Patient declined  No - Patient declined No - Patient declined    Current  Medications (verified) Outpatient Encounter Medications as of 01/27/2024  Medication Sig   ASPIRIN  LOW DOSE 81 MG tablet TAKE 1 TABLET BY MOUTH DAILY. swallow whole   atorvastatin  (LIPITOR) 40 MG tablet TAKE 1 TABLET BY MOUTH AT BEDTIME   Blood Glucose Monitoring Suppl (ACCU-CHEK GUIDE ME) w/Device KIT Use in the morning, at noon, and at bedtime.   clopidogrel  (PLAVIX ) 75 MG tablet Take 1 tablet (75 mg total) by mouth daily.   docusate sodium  (COLACE) 100 MG capsule Take 1 capsule (100 mg total) by mouth 2 (two) times daily.   empagliflozin  (JARDIANCE ) 10 MG TABS tablet Take 2 tablets (20 mg total) by mouth daily before breakfast.   glipiZIDE  (GLUCOTROL ) 5 MG tablet Take 1/2 tablet (2.5 mg total) by mouth 2 (two) times daily.   glucose blood (PIP BLOOD GLUCOSE TEST STRIP) test strip Use as instructed   losartan  (COZAAR ) 25 MG tablet TAKE 1 TABLET BY MOUTH DAILY   metFORMIN  (GLUCOPHAGE ) 1000 MG tablet TAKE 1 TABLET BY MOUTH TWICE DAILY WITH A MEAL   No facility-administered encounter medications on file as of 01/27/2024.    Allergies (verified) Patient has no known allergies.   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   Hypertension    Osteomyelitis of fifth toe of left foot (HCC) 09/08/2022   Peripheral arterial disease (HCC)    Type 2 diabetes mellitus with other specified complication [facility])    Past Surgical History:  Procedure Laterality Date   ABDOMINAL AORTOGRAM W/LOWER EXTREMITY N/A 09/10/2022   Procedure: ABDOMINAL AORTOGRAM W/LOWER EXTREMITY;  Surgeon: Lanis Fonda BRAVO, MD;  Location: [facility] INVASIVE CV LAB;  Service: Cardiovascular;  Laterality: N/A;   ABDOMINAL AORTOGRAM W/LOWER EXTREMITY N/A 02/12/2023   Procedure: ABDOMINAL AORTOGRAM W/LOWER EXTREMITY;  Surgeon: Serene Gaile ORN, MD;  Location: [facility] INVASIVE CV LAB;  Service: Cardiovascular;  Laterality: N/A;   AMPUTATION TOE Left 09/11/2022    Procedure: AMPUTATION LEFT FIFTH TOE;  Surgeon: Joya Stabs, DPM;  Location: [facility] OR;  Service: Podiatry;  Laterality: Left;   PERIPHERAL VASCULAR ATHERECTOMY  02/12/2023   Procedure: PERIPHERAL VASCULAR ATHERECTOMY;  Surgeon: Serene Gaile ORN, MD;  Location: [facility] INVASIVE CV LAB;  Service: Cardiovascular;;  Lt PT   PERIPHERAL VASCULAR BALLOON ANGIOPLASTY Left 09/10/2022   Procedure: PERIPHERAL VASCULAR BALLOON ANGIOPLASTY;  Surgeon: Lanis Fonda BRAVO, MD;  Location: [facility] INVASIVE CV LAB;  Service: Cardiovascular;  Laterality: Left;  Tibials   PERIPHERAL VASCULAR BALLOON ANGIOPLASTY  02/12/2023   Procedure: PERIPHERAL VASCULAR BALLOON ANGIOPLASTY;  Surgeon: Serene Gaile ORN, MD;  Location: [facility] INVASIVE CV LAB;  Service: Cardiovascular;;  Lt PT   PERIPHERAL VASCULAR INTERVENTION Left 09/10/2022   Procedure: PERIPHERAL VASCULAR INTERVENTION;  Surgeon: Lanis Fonda BRAVO, MD;  Location: [facility] INVASIVE CV LAB;  Service: Cardiovascular;  Laterality: Left;  SFA   PERIPHERAL VASCULAR INTERVENTION Left 02/12/2023   Procedure: PERIPHERAL VASCULAR INTERVENTION;  Surgeon: Serene Gaile ORN, MD;  Location: [facility] INVASIVE CV LAB;  Service: Cardiovascular;  Laterality: Left;  LCIA   History reviewed. No pertinent family history. Social History   Socioeconomic History   Marital status: Divorced    Spouse name: Not on file   Number of children: Not on file   Years of education: Not on file   Highest education level: Not on file  Occupational History   Not on file  Tobacco Use   Smoking status: Every Day    Types: Cigarettes   Smokeless tobacco: Never  Vaping Use   Vaping status: Never Used  Substance and Sexual Activity   Alcohol use: Not Currently   Drug use: Not on file   Sexual activity: Not on file  Other Topics Concern   Not on file  Social History Narrative   Not on file   Social Drivers of Health   Financial Resource Strain: High Risk (01/27/2024)   Overall Financial Resource Strain (CARDIA)     Difficulty of Paying Living Expenses: Hard  Food Insecurity: No Food Insecurity (01/27/2024)   Hunger Vital Sign    Worried About Running Out of Food in the Last Year: Never true    Ran Out of Food in the Last Year: Never true  Transportation Needs: No Transportation Needs (01/27/2024)   PRAPARE - Administrator, Civil Service (Medical): No    Lack of Transportation (Non-Medical): No  Physical Activity: Insufficiently Active (01/27/2024)   Exercise Vital Sign    Days of Exercise per Week: 7 days    Minutes of Exercise per Session: 20 min  Stress: No Stress Concern Present (01/27/2024)   [employer] of Occupational Health - Occupational Stress Questionnaire    Feeling of Stress: Not at all  Social Connections: Moderately Isolated (01/27/2024)   Social Connection and Isolation Panel    Frequency of Communication with Friends and Family: More than three times a week    Frequency of Social Gatherings with Friends and Family: More than three times a week    Attends  Religious Services: More than 4 times per year    Active Member of Clubs or Organizations: No    Attends Banker Meetings: Never    Marital Status: Widowed    Tobacco Counseling Ready to quit: Not Answered Counseling given: Yes    Clinical Intake:  Pre-visit preparation completed: Yes  Pain : No/denies pain     BMI - recorded: 31.79 Nutritional Status: BMI > 30  Obese Nutritional Risks: None Diabetes: No  Lab Results  Component Value Date   HGBA1C 7.2 (H) 01/23/2024   HGBA1C 6.2 (H) 07/25/2023   HGBA1C 6.6 (H) 03/12/2023     How often do you need to have someone help you when you read instructions, pamphlets, or other written materials from your doctor or pharmacy?: 1 - Never  Interpreter Needed?: No  Information entered by :: Alia t/cma   Activities of Daily Living     01/27/2024    2:40 PM  In your present state of health, do you have any difficulty performing the  following activities:  Hearing? 0  Vision? 0  Comment pt denies vision dif/pt goes to [facility] [address] in [place]/last apt 2mos ago  Difficulty concentrating or making decisions? 0  Walking or climbing stairs? 0  Dressing or bathing? 0  Doing errands, shopping? 0  Preparing Food and eating ? N  Using the Toilet? N  In the past six months, have you accidently leaked urine? N  Do you have problems with loss of bowel control? N  Managing your Medications? N  Managing your Finances? N  Housekeeping or managing your Housekeeping? N    Patient Care Team: Milian, Marry Lenis, FNP (Inactive) as PCP - General (Family Medicine)  I have updated your Care Teams any recent Medical Services you may have received from other providers in the past year.     Assessment:   This is a routine wellness examination for [place].  Hearing/Vision screen Hearing Screening - Comments:: Pt denies hearing dif Vision Screening - Comments:: pt denies vision dif/pt goes to [facility] [address] in [place]/last apt 2mos ago   Goals Addressed             This Visit's Progress    Exercise 3x per week (30 min per time)   On track      Depression Screen     01/27/2024    2:47 PM 01/23/2024    1:41 PM 07/25/2023    2:43 PM 01/24/2023    9:22 AM 12/06/2022   10:40 AM  PHQ 2/9 Scores  PHQ - 2 Score 0 0 0 0 0  PHQ- 9 Score 0 0  0 0    Fall Risk     01/27/2024    2:39 PM 01/23/2024    1:41 PM 01/23/2024    1:40 PM 07/25/2023    2:43 PM 01/24/2023    9:20 AM  Fall Risk   Falls in the past year? 0 0 0 0 0  Number falls in past yr: 0 0 0 0 0  Injury with Fall? 0 0 0 0 0  Risk for fall due to : No Fall Risks No Fall Risks No Fall Risks No Fall Risks No Fall Risks  Follow up Falls evaluation completed Falls evaluation completed Falls evaluation completed Falls evaluation completed Falls prevention discussed    MEDICARE RISK AT HOME:  Medicare Risk at Home Any stairs in or around the home?: Yes If so, are there  any without handrails?: Yes Home  free of loose throw rugs in walkways, pet beds, electrical cords, etc?: Yes Adequate lighting in your home to reduce risk of falls?: Yes Life alert?: No Use of a cane, walker or w/c?: No Grab bars in the bathroom?: No Shower chair or bench in shower?: No Elevated toilet seat or a handicapped toilet?: No  TIMED UP AND GO:  Was the test performed?  no  Cognitive Function: 6CIT completed        01/27/2024    2:51 PM 01/24/2023    9:23 AM  6CIT Screen  What Year? 0 points 0 points  What month? 0 points 0 points  What time? 0 points 0 points  Count back from 20 0 points 0 points  Months in reverse 0 points 0 points  Repeat phrase 8 points 0 points  Total Score 8 points 0 points    Immunizations  There is no immunization history on file for this patient.  Screening Tests Health Maintenance  Topic Date Due   Colonoscopy  Never done   COVID-19 Vaccine (1 - 2024-25 season) 02/08/2024 (Originally 03/31/2023)   Zoster Vaccines- Shingrix (1 of 2) 04/24/2024 (Originally 03/27/2009)   DTaP/Tdap/Td (1 - Tdap) 07/24/2024 (Originally 03/27/1978)   Pneumococcal Vaccine 53-74 Years old (1 of 2 - PCV) 01/22/2025 (Originally 03/27/1978)   Hepatitis C Screening  01/22/2025 (Originally 03/27/1977)   INFLUENZA VACCINE  02/28/2024   FOOT EXAM  03/11/2024   Diabetic kidney evaluation - Urine ACR  07/24/2024   HEMOGLOBIN A1C  07/24/2024   OPHTHALMOLOGY EXAM  08/23/2024   Diabetic kidney evaluation - eGFR measurement  01/22/2025   Medicare Annual Wellness (AWV)  01/26/2025   HIV Screening  Completed   Hepatitis B Vaccines  Aged Out   HPV VACCINES  Aged Out   Meningococcal B Vaccine  Aged Out    Health Maintenance  Health Maintenance Due  Topic Date Due   Colonoscopy  Never done   Health Maintenance Items Addressed: See Nurse Notes at the end of this note  Additional Screening:  Vision Screening: Recommended annual ophthalmology exams for early detection  of glaucoma and other disorders of the eye. Would you like a referral to an eye doctor? No    Dental Screening: Recommended annual dental exams for proper oral hygiene  Community Resource Referral / Chronic Care Management: CRR required this visit?  No   CCM required this visit?  No   Plan:    I have personally reviewed and noted the following in the patient's chart:   Medical and social history Use of alcohol, tobacco or illicit drugs  Current medications and supplements including opioid prescriptions. Patient is not currently taking opioid prescriptions. Functional ability and status Nutritional status Physical activity Advanced directives List of other physicians Hospitalizations, surgeries, and ER visits in previous 12 months Vitals Screenings to include cognitive, depression, and falls Referrals and appointments  In addition, I have reviewed and discussed with patient certain preventive protocols, quality metrics, and best practice recommendations. A written personalized care plan for preventive services as well as general preventive health recommendations were provided to patient.   Ozie Ned, CMA   02/06/2024   After Visit Summary: (Declined) Due to this being a telephonic visit, with patients personalized plan was offered to patient but patient Declined AVS at this time   Notes: Pt is aware due for Colonoscopy and was encourage to get exam done, however, pt declined. Pls also see routing msg.

## 2024-02-11 NOTE — Telephone Encounter (Signed)
 Rec'd completed patient portion. I will hold off on submitting until August.   Patient was also previously denied this year... this was most likely due to either being over income OR medicaid eligible. I will reach out to az&me to get details before submitting as well.

## 2024-02-12 ENCOUNTER — Ambulatory Visit: Admitting: Podiatry

## 2024-02-25 ENCOUNTER — Other Ambulatory Visit: Payer: Self-pay | Admitting: *Deleted

## 2024-02-25 ENCOUNTER — Other Ambulatory Visit (HOSPITAL_COMMUNITY): Payer: Self-pay

## 2024-02-25 ENCOUNTER — Other Ambulatory Visit: Payer: Self-pay | Admitting: Surgery

## 2024-02-25 ENCOUNTER — Telehealth: Payer: Self-pay

## 2024-02-25 DIAGNOSIS — E1151 Type 2 diabetes mellitus with diabetic peripheral angiopathy without gangrene: Secondary | ICD-10-CM

## 2024-02-25 DIAGNOSIS — E1169 Type 2 diabetes mellitus with other specified complication: Secondary | ICD-10-CM

## 2024-02-25 MED ORDER — METFORMIN HCL 1000 MG PO TABS: 1000.0000 mg | ORAL_TABLET | Freq: Two times a day (BID) | ORAL | 0 refills | Status: DC

## 2024-02-25 MED ORDER — LOSARTAN POTASSIUM 25 MG PO TABS: 25.0000 mg | ORAL_TABLET | Freq: Every day | ORAL | 1 refills | Status: AC

## 2024-02-25 MED ORDER — ATORVASTATIN CALCIUM 40 MG PO TABS
40.0000 mg | ORAL_TABLET | Freq: Every day | ORAL | 0 refills | Status: AC
Start: 2024-02-25 — End: ?

## 2024-02-25 MED ORDER — ACCU-CHEK GUIDE TEST VI STRP: ORAL_STRIP | 3 refills | Status: DC

## 2024-02-25 MED ORDER — DAPAGLIFLOZIN PROPANEDIOL 10 MG PO TABS: 10.0000 mg | ORAL_TABLET | Freq: Every day | ORAL | 5 refills | Status: DC

## 2024-02-25 NOTE — Telephone Encounter (Signed)
 I followed up with [employer] and patient was denied 08/12/23 due to not having eligible insurance coverage. They were seeing a commercial plan at the time.   Ive done another benefits verification and it seems he has 2 Part D coverages now (one has a $47 copay the other a $250 copay). I will attempt to the most recent application you sent (and update the signed date)!  Can you send a new RX to [facility] for this as well?! Thanks!

## 2024-02-25 NOTE — Progress Notes (Signed)
 Pharmacy Medication Assistance Program Note    02/25/2024  Patient ID: Evan Nelson, male  DOB: 05/12/1961, 63 y.o.  MRN:  [id number]     02/25/2024  Outreach Medication Two  Manufacturer Medication Two [employer]  [facility]   Type of Journalist, newspaper to [employer] Fax  Date Application Submitted to [employer] 02/25/2024     NEW - 2ND ATTEMPT AT ENROLLMENT  Requested RX be e-scribed on 01/24/24 telephone note.

## 2024-02-25 NOTE — Addendum Note (Signed)
 Addended by: Kaleea Penner D on: 02/25/2024 11:43 AM   Modules accepted: Orders

## 2024-03-02 ENCOUNTER — Ambulatory Visit (INDEPENDENT_AMBULATORY_CARE_PROVIDER_SITE_OTHER): Admitting: Podiatry

## 2024-03-02 ENCOUNTER — Encounter: Payer: Self-pay | Admitting: Podiatry

## 2024-03-02 DIAGNOSIS — M79674 Pain in right toe(s): Secondary | ICD-10-CM | POA: Diagnosis not present

## 2024-03-02 DIAGNOSIS — I739 Peripheral vascular disease, unspecified: Secondary | ICD-10-CM

## 2024-03-02 DIAGNOSIS — E1169 Type 2 diabetes mellitus with other specified complication: Secondary | ICD-10-CM

## 2024-03-02 DIAGNOSIS — B351 Tinea unguium: Secondary | ICD-10-CM

## 2024-03-02 DIAGNOSIS — M79675 Pain in left toe(s): Secondary | ICD-10-CM | POA: Diagnosis not present

## 2024-03-02 NOTE — Progress Notes (Signed)
 Subjective:  Patient ID: Evan Nelson, male    DOB: 08/20/1960,  MRN: [id number]  Chief Complaint  Patient presents with   Diabetes    I'm supposed to get my toenails cut.  Saw Marry Kins, FNP - 01/24/2024; A1c - 7.2    63 y.o. male returns for concern of thickened elongated and painful nails that are difficult to trim. Requesting to have them trimmed today. Relates burning and tingling in their feet. Patient is diabetic and last A1c was  Lab Results  Component Value Date   HGBA1C 7.2 (H) 01/23/2024   .   PCP:  Kins Marry Lenis, FNP (Inactive)   .   Review of Systems: Negative except as noted in the HPI. Denies N/V/F/Ch.  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 swallow whole, Disp: 30 tablet, Rfl: 12   atorvastatin  (LIPITOR) 40 MG tablet, Take 1 tablet (40 mg total) by mouth at bedtime., Disp: 90 tablet, Rfl: 0   Blood Glucose Monitoring Suppl (ACCU-CHEK GUIDE ME) w/Device KIT, Use in the morning, at noon, and at bedtime., Disp: 1 kit, Rfl: 0   clopidogrel  (PLAVIX ) 75 MG tablet, TAKE 1 TABLET BY MOUTH DAILY, Disp: 30 tablet, Rfl: 11   dapagliflozin  propanediol (FARXIGA ) 10 MG TABS tablet, Take 1 tablet (10 mg total) by mouth daily., Disp: 90 tablet, Rfl: 5   docusate sodium  (COLACE) 100 MG capsule, Take 1 capsule (100 mg total) by mouth 2 (two) times daily., Disp: 10 capsule, Rfl: 0   glipiZIDE  (GLUCOTROL ) 5 MG tablet, Take 1/2 tablet (2.5 mg total) by mouth 2 (two) times daily., Disp: 30 tablet, Rfl: 11   glucose blood (ACCU-CHEK GUIDE TEST) test strip, Test BS in the morning, at noon and at bedtime Dx  E11.69, Disp: 300 each, Rfl: 3   losartan  (COZAAR ) 25 MG tablet, Take 1 tablet (25 mg total) by mouth daily., Disp: 90 tablet, Rfl: 1   metFORMIN  (GLUCOPHAGE ) 1000 MG tablet, Take 1 tablet (1,000 mg total) by mouth 2 (two) times daily with a meal., Disp: 180 tablet, Rfl: 0  Social History   Tobacco Use  Smoking Status Former   Current packs/day: 0.00   Types: Cigarettes   Quit date: 08/2022   Years since quitting: 1.5  Smokeless Tobacco Never    No Known Allergies Objective:  There were no vitals filed for this visit. There is no height or weight on file to calculate BMI. Vascular: DP/PT pulses 2/4 bilateral. CFT <3 seconds. Absent hair growth on digits. Edema noted to bilateral lower extremities. Xerosis noted bilaterally.  Skin. No lacerations or abrasions bilateral feet. Nails 1-4 bilateral and right fifth l  are thickened discolored and elongated with subungual debris.  Musculoskeletal: MMT 5/5 bilateral lower extremities in DF, PF, Inversion and Eversion. Deceased ROM in DF of ankle joint. Amputation of left fifth ray.  Neurological: Sensation intact to light touch. Protective sensation diminished bilateral.    Radiographs: Interval resection of left fifth ray.  Assessment:   1. Pain due to onychomycosis of toenails of both feet   2. Type 2 diabetes mellitus with  other specified complication, without long-term current use of insulin  (HCC)   3. Peripheral vascular disease (HCC)             Plan:   -Discussed if any worsening redness, pain, fever or chills to call or may need to report to the emergency room. Patient expressed understanding.  -Discussed and educated patient on diabetic foot care, especially with  regards to the vascular, neurological and musculoskeletal systems.  -Stressed the importance of good glycemic control and the detriment of not  controlling glucose levels in relation to the foot. -Discussed supportive shoes at all times and checking feet  regularly.  -Mechanically debrided all nails 1-5 bilateral using sterile nail nipper and filed with dremel without incident  -Answered all patient questions -Patient to return  in 3 months for at risk foot care -Patient advised to call the office if any problems or questions arise in the meantime.   No follow-ups on file.    No follow-ups on file.

## 2024-03-02 NOTE — Progress Notes (Unsigned)
 03/03/2024 Name: Evan Nelson MRN: [id number] DOB: 1961-02-03  Chief Complaint  Patient presents with   Diabetes   Medication Assistance    JASMON GRAFFAM is a 63 y.o. year old male who presented for a telephone visit.  I connected with  Lamar LITTIE Mosses on 03/03/24 by telephone and verified that I am speaking with the correct person using two identifiers. I discussed the limitations of evaluation and management by telemedicine. The patient expressed understanding and agreed to proceed.  Patient was located in her home and PharmD in PCP office during this visit.   They were referred to the pharmacist by their PCP for assistance in managing diabetes.   Subjective:  Patient has now transitioned to state health plan and medicare.  He is currently having challenges with medication access to glucometer and Farxiga .  He reports his blood sugar has improved with the addition of SGLT2 samples.  Care Team: Primary Care Provider: Cathlene Marry Lenis, FNP (Inactive) ; Next Scheduled Visit: 07/24/2024 Podiatry: [place] Failing ; Next Scheduled Visit: 06/01/2024  Medication Access/Adherence  Current Pharmacy:  [facility] - [place], [place] - [address] [place] [address] Phone: [phone] Fax: [phone]  [facility] - [place], [place] - [address] [place] [address] Phone: [phone] Fax: [phone]  [facility] [address] [place] [address] Phone: [phone] Fax: [phone]  Patient reports affordability concerns with their medications: Yes  Patient reports access/transportation concerns to their pharmacy: No  Patient reports adherence concerns with their medications:  Yes  due to cost/medicare   Diabetes:  Current medications: metformin , Jardiance  20mg  daily (samples)-->metformin , Jardiance  25mg  daily (samples) Medications tried in the past: glipizide  01/23/24  - A1c 7.2%  up from 6.2 on 07/25/23  Current glucose readings: FBG 110-147 Using Accuchek GUIDE meter; testing daily (unable to pick up supplies due to $20 copay)  Patient denies hypoglycemic s/sx including dizziness, shakiness, sweating. Patient denies hyperglycemic symptoms including polyuria, polydipsia, polyphagia, nocturia, neuropathy, blurred vision.  Current meal patterns:  Discussed meal planning options and Plate method for healthy eating Avoid sugary drinks and desserts Incorporate balanced protein, non starchy veggies, 1 serving of carbohydrate with each meal Increase water intake Increase physical activity as able  Current medication access support: attempting [place] PAP access for Farxiga    Objective:  Lab Results  Component Value Date   HGBA1C 7.2 (H) 01/23/2024    Lab Results  Component Value Date   CREATININE 1.03 01/23/2024   BUN 12 01/23/2024   NA 140 01/23/2024   K 4.2 01/23/2024   CL 102 01/23/2024   CO2 22 01/23/2024    Lab Results  Component Value Date   CHOL 136 01/23/2024   HDL 34 (L) 01/23/2024   LDLCALC 75 01/23/2024   TRIG 158 (H) 01/23/2024   CHOLHDL 4.0 01/23/2024    Medications Reviewed Today     Reviewed by Billee Mliss BIRCH, [facility] (Pharmacist) on 03/03/24 at 1332  Med List Status: <None>   Medication Order Taking? Sig Documenting Provider Last Dose Status Informant  Accu-Chek Softclix Lancets lancets [id number] Yes Use as directed to check glucose daily. E11.65 Jolinda Norene HERO, DO  Active   ASPIRIN  LOW DOSE 81 MG tablet [id number]  TAKE 1 TABLET BY MOUTH DAILY. swallow whole Cathlene Marry Lenis, FNP  Active   atorvastatin  (LIPITOR) 40 MG tablet [id number]  Take 1 tablet (40  mg total) by mouth at bedtime. Severa Rock HERO, FNP  Active   Blood Glucose Monitoring Suppl (ACCU-CHEK GUIDE ME) w/Device KIT [id number]  Use as directed to check glucose daily. E11.65 Jolinda Norene HERO, DO  Active   clopidogrel  (PLAVIX ) 75 MG tablet [id number]  TAKE 1 TABLET  BY MOUTH DAILY Serene Gaile ORN, MD  Active   dapagliflozin  propanediol (FARXIGA ) 10 MG TABS tablet [id number]  Take 1 tablet (10 mg total) by mouth daily.  Patient not taking: Reported on 03/03/2024   Jolinda Norene M, DO  Active   docusate sodium  (COLACE) 100 MG capsule [id number]  Take 1 capsule (100 mg total) by mouth 2 (two) times daily. Raenelle Coria, MD  Active   empagliflozin  (JARDIANCE ) 25 MG TABS tablet [id number]  Take 1 tablet (25 mg total) by mouth daily before breakfast. Onu#[id number] exp2/28/27 #28tabs Jolinda Norene M, DO  Active   glucose blood (ACCU-CHEK GUIDE TEST) test strip [id number]  Use as directed to check glucose daily. E11.65 Jolinda Norene M, DO  Active   losartan  (COZAAR ) 25 MG tablet [id number]  Take 1 tablet (25 mg total) by mouth daily. Severa Rock HERO, FNP  Active   metFORMIN  (GLUCOPHAGE ) 1000 MG tablet [id number]  Take 1 tablet (1,000 mg total) by mouth 2 (two) times daily with a meal. Rakes, Rock HERO, FNP  Active               Assessment/Plan:   Diabetes: - Currently uncontrolled, but improving.  Patient  - Reviewed long term cardiovascular and renal outcomes of uncontrolled blood sugar - Reviewed goal A1c, goal fasting, and goal 2 hour post prandial glucose - Recommend to : Continue metformin  Increase to Jardiance  25mg  daily (samples placed up front for pick up) Attempt to get Farxiga  10 mg daily through patient assistance (pending) Glucometer access to be follow by CPhT; appreciate assistance  - Patient denies personal or family history of multiple endocrine neoplasia type 2, medullary thyroid cancer; personal history of pancreatitis or gallbladder disease. - Recommend to check glucose daily (fasting) or if symptomatic   Follow Up Plan: 2 weeks  Mliss Tarry Griffin, PharmD, BCACP, CPP Clinical Pharmacist, [facility]

## 2024-03-03 ENCOUNTER — Other Ambulatory Visit: Payer: Self-pay

## 2024-03-03 ENCOUNTER — Other Ambulatory Visit (HOSPITAL_COMMUNITY): Payer: Self-pay

## 2024-03-03 ENCOUNTER — Telehealth: Payer: Self-pay | Admitting: Pharmacist

## 2024-03-03 ENCOUNTER — Other Ambulatory Visit (INDEPENDENT_AMBULATORY_CARE_PROVIDER_SITE_OTHER)

## 2024-03-03 DIAGNOSIS — Z7984 Long term (current) use of oral hypoglycemic drugs: Secondary | ICD-10-CM

## 2024-03-03 DIAGNOSIS — E1169 Type 2 diabetes mellitus with other specified complication: Secondary | ICD-10-CM

## 2024-03-03 DIAGNOSIS — E119 Type 2 diabetes mellitus without complications: Secondary | ICD-10-CM

## 2024-03-03 MED ORDER — ACCU-CHEK GUIDE ME W/DEVICE KIT
PACK | 0 refills | Status: AC
  Filled 2024-03-03: qty 1, 30d supply, fill #0
  Filled 2024-03-04: qty 1, 100d supply, fill #0

## 2024-03-03 MED ORDER — DAPAGLIFLOZIN PROPANEDIOL 10 MG PO TABS
10.0000 mg | ORAL_TABLET | Freq: Every day | ORAL | 5 refills | Status: AC
  Filled 2024-03-03 – 2024-03-04 (×2): qty 90, 90d supply, fill #0
  Filled 2024-05-26: qty 90, 90d supply, fill #1
  Filled 2024-08-06: qty 90, 90d supply, fill #2

## 2024-03-03 MED ORDER — EMPAGLIFLOZIN 25 MG PO TABS: 25.0000 mg | ORAL_TABLET | Freq: Every day | ORAL | Status: AC

## 2024-03-03 MED ORDER — ACCU-CHEK SOFTCLIX LANCETS MISC
5 refills | Status: AC
  Filled 2024-03-03: qty 100, 30d supply, fill #0
  Filled 2024-03-04: qty 100, 100d supply, fill #0
  Filled 2024-06-05: qty 100, 100d supply, fill #1

## 2024-03-03 MED ORDER — ACCU-CHEK GUIDE TEST VI STRP
ORAL_STRIP | 3 refills | Status: AC
  Filled 2024-03-03: qty 50, 50d supply, fill #0
  Filled 2024-03-03: qty 100, 30d supply, fill #0
  Filled 2024-03-04: qty 100, 90d supply, fill #0
  Filled 2024-03-04: qty 100, 100d supply, fill #0
  Filled 2024-03-04: qty 100, 90d supply, fill #0
  Filled 2024-06-05: qty 100, 100d supply, fill #1

## 2024-03-03 NOTE — Telephone Encounter (Signed)
 Can you follow up with patient about copays?--new to medicare/appears to still have state coverage as commercial (run both usually) Sent Rxs to [facility] Would like delivered But some may too expensive  Trying to get on Farxiga  PAP (have give Jardiance  samples to bridge--unable to get patient assistance due to commercial bc state plan retirees still get commercial plus medicare, but camille still trying-->please f/u with her as well)

## 2024-03-03 NOTE — Telephone Encounter (Signed)
 Hey! It appears patient now had med D ([facility]), however is still flagging [facility] too I'm assuming we cannot apply until [facility] has dropped off No rush! Just wanted to make sure

## 2024-03-04 ENCOUNTER — Other Ambulatory Visit (HOSPITAL_COMMUNITY): Payer: Self-pay

## 2024-03-04 ENCOUNTER — Other Ambulatory Visit: Payer: Self-pay

## 2024-03-04 ENCOUNTER — Telehealth: Payer: Self-pay | Admitting: Pharmacy Technician

## 2024-03-04 DIAGNOSIS — E1169 Type 2 diabetes mellitus with other specified complication: Secondary | ICD-10-CM

## 2024-03-04 NOTE — Progress Notes (Signed)
   03/04/2024 Name: Evan Nelson MRN: [id number] DOB: 1960/12/30  Patient is appearing on a report for [facility] [place] [facility] Diabetes and last engaged with the clinical pharmacist to discuss diabetes on 03/03/2024. Contacted patient today to discuss diabetes management and completed medication review.   Diabetes Plan from last clinical pharmacist appointment : Diabetes: - Currently uncontrolled, but improving.  Patient  - Reviewed long term cardiovascular and renal outcomes of uncontrolled blood sugar - Reviewed goal A1c, goal fasting, and goal 2 hour post prandial glucose - Recommend to : Continue metformin  Increase to Jardiance  25mg  daily (samples placed up front for pick up) Attempt to get Farxiga  10 mg daily through patient assistance (pending) Glucometer access to be follow by CPhT; appreciate assistance  - Patient denies personal or family history of multiple endocrine neoplasia type 2, medullary thyroid cancer; personal history of pancreatitis or gallbladder disease. - Recommend to check glucose daily (fasting) or if symptomatic(copy/paste from last note)   Medication Adherence Barriers Identified:  Patient made recommended medication changes per plan: Yes  Access issues with any new medication or testing device: Yes Patient is checking blood sugars as prescribed: Yes  Call placed to [facility]. Per pharmacy team member, the glucometer is free and the lancets for #100 were $10. She informs the strips were filled elsewhere and the Farxiga  was $141 with his retirement [employer] school [employer]. Requested the claim be split billed with his Medicare D insurance. Team member informs when billing with retirement commercial plan as primary and Med D as secondary or vice versa a rejection is given to bill to primary first. She informs both insurances are seen as primary. The cost with Med D plan is over $400. Pharmacy team member was able to bill with retirement commercial plan and a  coupon card and the cost is $0 for 90 day supply. Called [facility] and requested claim be reversed so [facility] could bill claim. Called [facility] back and requested the strips be filled. Per pharmacy team member, the cost of #100 strips is $30. Therefore the cost every 3 months will be $40 for #100 strips, #100 lancest and #90 Farxiga . Reported this information back to the PharmD at [facility]. Called patient and provided this information to him. Also provided him phone number to [facility] as they are in need of a credit card to bill the charges. Requested he call to provide this number and to request delivery. Patient verbalized understanding. Medication Adherence Barriers Addressed/Actions Taken:  Reviewed medication changes per plan from last clinical pharmacist note Contacted pharmacy regarding new prescriptions Educated patient to contact pharmacy regarding new prescriptions and refills Below is the copay card provided to the pharmacy for Farxiga .   Reviewed instructions for monitoring blood sugars at home and reminded patient to keep a written log to review with pharmacist Reminded patient of date/time of upcoming clinical pharmacist follow up and any upcoming PCP/specialists visits. Patient denies transportation barriers to the appointment. Yes  Next clinical pharmacist appointment is scheduled for: TBD  Kate Caddy, CPhT [facility] Office: [phone] Email: [email or web address]

## 2024-03-16 ENCOUNTER — Other Ambulatory Visit: Payer: Self-pay | Admitting: Family Medicine

## 2024-03-16 ENCOUNTER — Other Ambulatory Visit (HOSPITAL_COMMUNITY): Payer: Self-pay

## 2024-03-17 ENCOUNTER — Other Ambulatory Visit: Payer: Self-pay

## 2024-03-17 ENCOUNTER — Telehealth: Payer: Self-pay

## 2024-03-17 ENCOUNTER — Other Ambulatory Visit (HOSPITAL_COMMUNITY): Payer: Self-pay

## 2024-03-17 ENCOUNTER — Encounter (HOSPITAL_COMMUNITY): Payer: Self-pay

## 2024-03-17 DIAGNOSIS — E1151 Type 2 diabetes mellitus with diabetic peripheral angiopathy without gangrene: Secondary | ICD-10-CM

## 2024-03-17 NOTE — Telephone Encounter (Signed)
 Pt was told from [facility] they can not fill this since it was sent to [facility] pharmacy. He was told that doctor's office would have to call to have this refilled  Copied from CRM #[id number]. Topic: Clinical - Prescription Issue >> Mar 17, 2024  9:39 AM [name] wrote: clopidogrel  (PLAVIX ) 75 MG tablet, aspirin  EC (ASPIRIN  LOW DOSE) 81 MG tablet- Patient talked with pharmacy and they told him they haven't received  a request to fill his medications. >> Mar 17, 2024  3:45 PM [name] wrote: [facility] - [place], [place] - [address] told pt there is a hold on the medication due to medication sent to [facility]. Pt want medication to go to [facility] fax: [phone] ph: [phone]. Pt would like a call back at [phone]    >> Mar 17, 2024  3:11 PM [name] wrote: Patient called asking about medication refill read verbatim note:                  [name], CPhT to Evan Nelson     03/17/24 12:48 PM The refill for your prescription for Aspirin  and Clopidogrel  has been denied by your provider.  The provider stated that new prescriptions were sent to [facility]. If you would like Evan Nelson to transfer and fill please call [phone].  This MyChart message has not been read.

## 2024-03-17 NOTE — Telephone Encounter (Signed)
 Informed pt a years worth of both medications should be at [facility] already. Pt verbalized understanding

## 2024-03-17 NOTE — Telephone Encounter (Signed)
 Copied from CRM [id number]. Topic: Clinical - Prescription Issue >> Mar 17, 2024  9:39 AM [name] wrote: clopidogrel  (PLAVIX ) 75 MG tablet, aspirin  EC (ASPIRIN  LOW DOSE) 81 MG tablet- Patient talked with pharmacy and they told him they haven't received  a request to fill his medications.

## 2024-03-18 ENCOUNTER — Other Ambulatory Visit (HOSPITAL_COMMUNITY): Payer: Self-pay

## 2024-03-18 MED ORDER — CLOPIDOGREL BISULFATE 75 MG PO TABS
75.0000 mg | ORAL_TABLET | Freq: Every day | ORAL | 9 refills | Status: AC
  Filled 2024-03-18: qty 30, 30d supply, fill #0
  Filled 2024-04-21: qty 30, 30d supply, fill #1

## 2024-03-18 MED ORDER — ASPIRIN 81 MG PO TBEC: 81.0000 mg | DELAYED_RELEASE_TABLET | Freq: Every day | ORAL | 9 refills | Status: DC

## 2024-03-18 MED ORDER — ASPIRIN 81 MG PO TBEC
81.0000 mg | DELAYED_RELEASE_TABLET | Freq: Every day | ORAL | 9 refills | Status: AC
  Filled 2024-03-18: qty 30, 30d supply, fill #0
  Filled 2024-04-21: qty 30, 30d supply, fill #1
  Filled 2024-05-15: qty 30, 30d supply, fill #2
  Filled 2024-06-14: qty 30, 30d supply, fill #3
  Filled 2024-07-14 – 2024-08-06 (×2): qty 30, 30d supply, fill #4

## 2024-03-18 MED ORDER — CLOPIDOGREL BISULFATE 75 MG PO TABS: 75.0000 mg | ORAL_TABLET | Freq: Every day | ORAL | 9 refills | Status: DC

## 2024-03-18 NOTE — Addendum Note (Signed)
 Addended by: Tahisha Hakim D on: 03/18/2024 08:22 AM   Modules accepted: Orders

## 2024-03-18 NOTE — Telephone Encounter (Addendum)
 TCB to pt since requests came in from [facility] and from [facility] He wants these medications to go to [facility], remaining refills have been sent.

## 2024-04-21 ENCOUNTER — Other Ambulatory Visit (HOSPITAL_COMMUNITY): Payer: Self-pay

## 2024-04-22 ENCOUNTER — Other Ambulatory Visit (HOSPITAL_COMMUNITY): Payer: Self-pay

## 2024-04-23 ENCOUNTER — Other Ambulatory Visit (HOSPITAL_COMMUNITY): Payer: Self-pay

## 2024-04-23 MED ORDER — CLOPIDOGREL BISULFATE 75 MG PO TABS
75.0000 mg | ORAL_TABLET | Freq: Every day | ORAL | 11 refills | Status: AC
  Filled 2024-05-15: qty 30, 30d supply, fill #0
  Filled 2024-06-14: qty 30, 30d supply, fill #1
  Filled 2024-07-14 – 2024-08-06 (×2): qty 30, 30d supply, fill #2

## 2024-05-15 ENCOUNTER — Other Ambulatory Visit: Payer: Self-pay

## 2024-05-15 ENCOUNTER — Other Ambulatory Visit (HOSPITAL_COMMUNITY): Payer: Self-pay

## 2024-05-26 ENCOUNTER — Other Ambulatory Visit: Payer: Self-pay

## 2024-06-01 ENCOUNTER — Other Ambulatory Visit: Payer: Self-pay | Admitting: *Deleted

## 2024-06-01 ENCOUNTER — Ambulatory Visit (INDEPENDENT_AMBULATORY_CARE_PROVIDER_SITE_OTHER): Admitting: Podiatry

## 2024-06-01 ENCOUNTER — Telehealth: Payer: Self-pay | Admitting: Podiatry

## 2024-06-01 DIAGNOSIS — E1169 Type 2 diabetes mellitus with other specified complication: Secondary | ICD-10-CM

## 2024-06-01 DIAGNOSIS — Z91199 Patient's noncompliance with other medical treatment and regimen due to unspecified reason: Secondary | ICD-10-CM

## 2024-06-01 NOTE — Progress Notes (Signed)
 Cancel 24 hours

## 2024-06-01 NOTE — Telephone Encounter (Signed)
 Patient would like referral for diabetic shoes

## 2024-06-15 ENCOUNTER — Other Ambulatory Visit (HOSPITAL_COMMUNITY): Payer: Self-pay

## 2024-06-15 ENCOUNTER — Ambulatory Visit: Admitting: Podiatry

## 2024-06-15 ENCOUNTER — Encounter: Payer: Self-pay | Admitting: Podiatry

## 2024-06-15 ENCOUNTER — Other Ambulatory Visit: Payer: Self-pay

## 2024-06-15 DIAGNOSIS — M79675 Pain in left toe(s): Secondary | ICD-10-CM

## 2024-06-15 DIAGNOSIS — M79674 Pain in right toe(s): Secondary | ICD-10-CM

## 2024-06-15 DIAGNOSIS — B351 Tinea unguium: Secondary | ICD-10-CM

## 2024-06-15 NOTE — Progress Notes (Signed)
 Subjective:  Patient ID: Evan Nelson, male    DOB: 1960/08/25,  MRN: [id number]  Chief Complaint  Patient presents with   Diabetes    Toenails and scrape my toe again.  They never called me about making an appointment.  Saw Marry Kins, FNP - 01/24/2024; A1c - 7.2    63 y.o. male returns for concern of thickened elongated and painful nails that are difficult to trim. Requesting to have them trimmed today. Relates burning and tingling in their feet. Patient is diabetic and last A1c was  Lab Results  Component Value Date   HGBA1C 7.2 (H) 01/23/2024   .   PCP:  Kins Marry Lenis, FNP   .   Review of Systems: Negative except as noted in the HPI. Denies N/V/F/Ch.  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 E11.65, Disp: 200 each, Rfl: 5   aspirin  EC (ASPIRIN  LOW DOSE) 81 MG tablet, Take 1 tablet (81 mg total) by mouth daily. Swallow whole., Disp: 30 tablet, Rfl: 9   atorvastatin  (LIPITOR) 40 MG tablet, Take 1 tablet (40 mg total) by mouth at bedtime., Disp: 90 tablet, Rfl: 0   Blood Glucose Monitoring Suppl (ACCU-CHEK GUIDE ME) w/Device KIT, Use as directed to check glucose daily. E11.65, Disp: 1 kit, Rfl: 0   clopidogrel  (PLAVIX ) 75 MG tablet, Take 1 tablet (75 mg total) by mouth daily., Disp: 30 tablet, Rfl: 9   clopidogrel  (PLAVIX ) 75 MG tablet, Take 1 tablet (75 mg total) by mouth daily., Disp: 30 tablet, Rfl: 11   dapagliflozin  propanediol (FARXIGA ) 10 MG TABS tablet, Take 1 tablet (10 mg total) by mouth daily., Disp: 90 tablet, Rfl: 5   docusate sodium  (COLACE) 100 MG capsule, Take 1 capsule (100  mg total) by mouth 2 (two) times daily., Disp: 10 capsule, Rfl: 0   empagliflozin  (JARDIANCE ) 25 MG TABS tablet, Take 1 tablet (25 mg total) by mouth daily before breakfast. Onu#[id number] exp2/28/27 #28tabs, Disp: , Rfl:    glucose blood (ACCU-CHEK GUIDE TEST) test strip, Use as directed to check glucose daily. E11.65, Disp: 300 each, Rfl: 3   losartan  (COZAAR ) 25 MG tablet, Take 1 tablet (25 mg total) by mouth daily., Disp: 90 tablet, Rfl: 1   metFORMIN  (GLUCOPHAGE ) 1000 MG tablet, TAKE 1 TABLET BY MOUTH TWICE DAILY WITH A MEAL, Disp: 180 tablet, Rfl: 0  Social History   Tobacco Use  Smoking Status Former   Current packs/day: 0.00   Types: Cigarettes   Quit date: 08/2022   Years since quitting: 1.7  Smokeless Tobacco Never    No Known Allergies Objective:  There were no vitals filed for this visit. There is no height or weight on file to calculate BMI. Vascular: DP/PT pulses 2/4 bilateral. CFT <3 seconds. Absent hair growth on digits. Edema noted to bilateral lower extremities. Xerosis noted bilaterally.  Skin. No lacerations or abrasions bilateral feet. Nails 1-4 bilateral and right fifth l  are thickened discolored and elongated with subungual debris.  Musculoskeletal: MMT 5/5 bilateral lower extremities in DF, PF, Inversion and Eversion. Deceased ROM in DF of ankle joint. Amputation of  left fifth ray.  Neurological: Sensation intact to light touch. Protective sensation diminished bilateral.    Radiographs: Interval resection of left fifth ray.  Assessment:   1. Pain due to onychomycosis of toenails of both feet             Plan:   -Discussed if any worsening redness, pain, fever or chills to call or may need to report to the emergency room. Patient expressed understanding.  -Discussed and educated patient on diabetic foot care, especially with  regards to the vascular, neurological and musculoskeletal systems.  -Stressed the importance of good glycemic control and the  detriment of not  controlling glucose levels in relation to the foot. -Discussed supportive shoes at all times and checking feet regularly.  -Mechanically debrided all nails 1-5 bilateral using sterile nail nipper and filed with dremel without incident  -Answered all patient questions -Patient to return  in 3 months for at risk foot care -Patient advised to call the office if any problems or questions arise in the meantime.   Return in about 3 months (around 09/15/2024) for rfc.    Return in about 3 months (around 09/15/2024) for rfc.

## 2024-07-14 ENCOUNTER — Other Ambulatory Visit: Payer: Self-pay

## 2024-07-14 ENCOUNTER — Other Ambulatory Visit (HOSPITAL_COMMUNITY): Payer: Self-pay

## 2024-07-16 ENCOUNTER — Other Ambulatory Visit: Payer: Self-pay

## 2024-07-24 ENCOUNTER — Ambulatory Visit: Payer: Self-pay | Admitting: Family Medicine

## 2024-08-06 ENCOUNTER — Other Ambulatory Visit (HOSPITAL_COMMUNITY): Payer: Self-pay

## 2024-08-18 ENCOUNTER — Ambulatory Visit: Payer: Self-pay | Admitting: Family Medicine

## 2024-09-02 ENCOUNTER — Ambulatory Visit: Payer: Self-pay | Admitting: Family Medicine

## 2024-09-02 DIAGNOSIS — E1169 Type 2 diabetes mellitus with other specified complication: Secondary | ICD-10-CM

## 2024-09-02 DIAGNOSIS — E1151 Type 2 diabetes mellitus with diabetic peripheral angiopathy without gangrene: Secondary | ICD-10-CM

## 2024-09-16 ENCOUNTER — Ambulatory Visit: Payer: Self-pay | Admitting: Family Medicine

## 2024-09-16 ENCOUNTER — Ambulatory Visit: Admitting: Podiatry

## 2025-01-27 ENCOUNTER — Ambulatory Visit: Payer: Self-pay

## 2025-02-02 ENCOUNTER — Ambulatory Visit
